# Patient Record
Sex: Female | Born: 1940 | Race: White | Hispanic: No | Smoking: Former smoker
Health system: Southern US, Community
[De-identification: ages and names within clinical notes are randomized; demographics above are authoritative.]

## PROBLEM LIST (undated history)

## (undated) DIAGNOSIS — E785 Hyperlipidemia, unspecified: Secondary | ICD-10-CM

## (undated) DIAGNOSIS — K219 Gastro-esophageal reflux disease without esophagitis: Secondary | ICD-10-CM

## (undated) DIAGNOSIS — I10 Essential (primary) hypertension: Secondary | ICD-10-CM

## (undated) HISTORY — DX: Essential (primary) hypertension: I10

## (undated) HISTORY — DX: Gastro-esophageal reflux disease without esophagitis: K21.9

## (undated) HISTORY — DX: Hyperlipidemia, unspecified: E78.5

## (undated) HISTORY — PX: APPENDECTOMY: SHX54

---
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
## 1969-02-07 HISTORY — PX: TUBAL LIGATION: SHX77

## 1971-02-08 HISTORY — PX: LUNG REMOVAL, PARTIAL: SHX233

## 1972-02-08 HISTORY — PX: ABDOMINAL HYSTERECTOMY: SHX81

## 1994-02-07 HISTORY — PX: CERVICAL FUSION: SHX112

## 2003-11-08 ENCOUNTER — Emergency Department: Payer: Self-pay | Admitting: Unknown Physician Specialty

## 2008-01-30 ENCOUNTER — Emergency Department: Payer: Self-pay | Admitting: Emergency Medicine

## 2008-04-10 ENCOUNTER — Encounter: Payer: Self-pay | Admitting: Pulmonary Disease

## 2008-12-17 ENCOUNTER — Encounter: Payer: Self-pay | Admitting: Pulmonary Disease

## 2009-03-02 ENCOUNTER — Encounter: Payer: Self-pay | Admitting: Pulmonary Disease

## 2009-04-02 ENCOUNTER — Telehealth: Payer: Self-pay | Admitting: Pulmonary Disease

## 2009-04-02 ENCOUNTER — Ambulatory Visit: Payer: Self-pay | Admitting: Pulmonary Disease

## 2009-04-02 DIAGNOSIS — E785 Hyperlipidemia, unspecified: Secondary | ICD-10-CM

## 2009-04-02 DIAGNOSIS — J9610 Chronic respiratory failure, unspecified whether with hypoxia or hypercapnia: Secondary | ICD-10-CM

## 2009-04-02 DIAGNOSIS — J309 Allergic rhinitis, unspecified: Secondary | ICD-10-CM | POA: Insufficient documentation

## 2009-04-02 DIAGNOSIS — J438 Other emphysema: Secondary | ICD-10-CM

## 2009-04-02 DIAGNOSIS — I10 Essential (primary) hypertension: Secondary | ICD-10-CM | POA: Insufficient documentation

## 2009-04-08 ENCOUNTER — Telehealth: Payer: Self-pay | Admitting: Pulmonary Disease

## 2009-04-30 ENCOUNTER — Telehealth: Payer: Self-pay | Admitting: Pulmonary Disease

## 2009-06-17 ENCOUNTER — Telehealth (INDEPENDENT_AMBULATORY_CARE_PROVIDER_SITE_OTHER): Payer: Self-pay | Admitting: *Deleted

## 2009-06-19 ENCOUNTER — Ambulatory Visit: Payer: Self-pay | Admitting: Pulmonary Disease

## 2009-06-19 ENCOUNTER — Inpatient Hospital Stay (HOSPITAL_COMMUNITY): Admission: AD | Admit: 2009-06-19 | Discharge: 2009-06-22 | Payer: Self-pay | Admitting: Pulmonary Disease

## 2009-07-08 ENCOUNTER — Ambulatory Visit: Payer: Self-pay | Admitting: Pulmonary Disease

## 2009-08-18 ENCOUNTER — Telehealth (INDEPENDENT_AMBULATORY_CARE_PROVIDER_SITE_OTHER): Payer: Self-pay | Admitting: *Deleted

## 2009-08-19 ENCOUNTER — Ambulatory Visit: Payer: Self-pay | Admitting: Pulmonary Disease

## 2009-11-05 ENCOUNTER — Ambulatory Visit: Payer: Self-pay | Admitting: Pulmonary Disease

## 2010-01-11 ENCOUNTER — Inpatient Hospital Stay (HOSPITAL_COMMUNITY)
Admission: EM | Admit: 2010-01-11 | Discharge: 2010-01-15 | Payer: Self-pay | Source: Home / Self Care | Attending: Pulmonary Disease | Admitting: Pulmonary Disease

## 2010-02-04 ENCOUNTER — Ambulatory Visit: Payer: Self-pay | Admitting: Pulmonary Disease

## 2010-02-04 DIAGNOSIS — J329 Chronic sinusitis, unspecified: Secondary | ICD-10-CM | POA: Insufficient documentation

## 2010-03-04 ENCOUNTER — Telehealth (INDEPENDENT_AMBULATORY_CARE_PROVIDER_SITE_OTHER): Payer: Self-pay | Admitting: *Deleted

## 2010-03-04 ENCOUNTER — Inpatient Hospital Stay (HOSPITAL_COMMUNITY)
Admission: EM | Admit: 2010-03-04 | Discharge: 2010-03-06 | Payer: Self-pay | Source: Home / Self Care | Attending: Internal Medicine | Admitting: Internal Medicine

## 2010-03-04 LAB — POCT CARDIAC MARKERS
CKMB, poc: 4 ng/mL (ref 1.0–8.0)
Myoglobin, poc: 274 ng/mL (ref 12–200)

## 2010-03-04 LAB — DIFFERENTIAL
Basophils Absolute: 0 10*3/uL (ref 0.0–0.1)
Lymphocytes Relative: 7 % — ABNORMAL LOW (ref 12–46)
Monocytes Relative: 9 % (ref 3–12)
Neutro Abs: 12.2 10*3/uL — ABNORMAL HIGH (ref 1.7–7.7)
Neutrophils Relative %: 84 % — ABNORMAL HIGH (ref 43–77)

## 2010-03-04 LAB — CBC
Hemoglobin: 10.2 g/dL — ABNORMAL LOW (ref 12.0–15.0)
MCHC: 33 g/dL (ref 30.0–36.0)
MCV: 89.8 fL (ref 78.0–100.0)
RBC: 3.44 MIL/uL — ABNORMAL LOW (ref 3.87–5.11)
RDW: 14.5 % (ref 11.5–15.5)

## 2010-03-04 LAB — BASIC METABOLIC PANEL
BUN: 22 mg/dL (ref 6–23)
CO2: 31 mEq/L (ref 19–32)
Chloride: 98 mEq/L (ref 96–112)
Creatinine, Ser: 1.15 mg/dL (ref 0.4–1.2)
GFR calc non Af Amer: 47 mL/min — ABNORMAL LOW (ref >60)

## 2010-03-05 LAB — CBC
HCT: 27.5 % — ABNORMAL LOW (ref 36.0–46.0)
MCH: 29.1 pg (ref 26.0–34.0)
MCHC: 32.7 g/dL (ref 30.0–36.0)
RBC: 3.09 MIL/uL — ABNORMAL LOW (ref 3.87–5.11)
RDW: 14.4 % (ref 11.5–15.5)

## 2010-03-05 LAB — BASIC METABOLIC PANEL
CO2: 30 mEq/L (ref 19–32)
Chloride: 101 mEq/L (ref 96–112)
GFR calc Af Amer: >60 mL/min (ref >60)
Glucose, Bld: 169 mg/dL — ABNORMAL HIGH (ref 70–99)
Potassium: 4.9 mEq/L (ref 3.5–5.1)
Sodium: 140 mEq/L (ref 135–145)

## 2010-03-06 LAB — BASIC METABOLIC PANEL
BUN: 27 mg/dL — ABNORMAL HIGH (ref 6–23)
CO2: 31 mEq/L (ref 19–32)
Chloride: 97 mEq/L (ref 96–112)
GFR calc Af Amer: 59 mL/min — ABNORMAL LOW (ref >60)
Glucose, Bld: 193 mg/dL — ABNORMAL HIGH (ref 70–99)
Sodium: 137 mEq/L (ref 135–145)

## 2010-03-06 LAB — CBC
Hemoglobin: 9.1 g/dL — ABNORMAL LOW (ref 12.0–15.0)
MCHC: 32.6 g/dL (ref 30.0–36.0)
Platelets: 265 10*3/uL (ref 150–400)
RBC: 3.1 MIL/uL — ABNORMAL LOW (ref 3.87–5.11)

## 2010-03-06 LAB — MAGNESIUM: Magnesium: 2.1 mg/dL (ref 1.5–2.5)

## 2010-03-07 NOTE — Discharge Summary (Signed)
[name], [name]                   ACCOUNT NO.:  [id number]  MEDICAL RECORD NO.:  [id number]          PATIENT TYPE:  INP  LOCATION:  1321                         FACILITY:  [facility]  PHYSICIAN:  Andreas Blower, MD       DATE OF BIRTH:  22-May-1940  DATE OF ADMISSION:  03/04/2010 DATE OF DISCHARGE:                              DISCHARGE SUMMARY   PRIMARY CARE PHYSICIAN:  Dr. Drinda Butts at [place].  PULMONOLOGIST:  Barbaraann Share, MD,FCCP  DISCHARGE DIAGNOSES: 1. Shortness of breath, multifactorial due to chronic obstructive     pulmonary disease exacerbation with component of possible diastolic     heart failure. 2. Acute chronic obstructive pulmonary disease exacerbation. 3. Possible diastolic heart failure. 4. Leukocytosis. 5. Hypertension. 6. Depression. 7. Severe stage IV chronic obstructive pulmonary disease. 8. History of chronic sinusitis.  DISCHARGE MEDICATIONS: 1. Lasix 20 mg p.o. q.a.m.  The patient given a prescription for 10     days. 2. Levofloxacin 500 mg p.o. daily to complete 5 day course of     antibiotics. 3. Prednisone 20 mg p.o. daily for one day, then 10 mg p.o. daily for     one day, then 5 mg p.o. daily for one day and then discontinue. 4. Advair Diskus 500/50 one puff inhaled twice daily. 5. Aleve 220 mg p.o. q.12 hours. 6. Aspirin 81 mg daily. 7. Citalopram 40 mg p.o. daily. 8. Flonase two sprays nasally daily. 9. Guaifenesin 400 mg p.o. q.4 hours as needed for congestion. 10.Loratadine 100 mg p.o. daily. 11.Multivitamin once tablet p.o. daily. 12.Prevacid 15 mg p.o. daily. 13.Robitussin DM 100/10 per 5 mL daily at bedtime. 14.Simvastatin 80 mg p.o. daily. 15.Spiriva 18 mcg p.o. daily> 16.Tylenol PM 500/25 mg one to two tablets p.o. daily at bedtime as     needed for sleep. 17.Albuterol 2.5 mg per 3 mL every 4 hours as needed.  BRIEF ADMITTING HISTORY AND PHYSICAL:  [name] is a 70 year old obese Caucasian female with history of severe stage IV  Gold Criteria COPD on 3 liters of home O2 who presents with shortness of breath, nonproductive cough with greenish-colored sputum.  RADIOLOGY/IMAGING:  The patient had a portable chest x-ray which shows findings that most likely represent diffuse interstitial edema.  LABORATORY DATA:  CBC shows a white count of 13.9, hemoglobin 9.1, hematocrit 27.9, platelet count 265.  Electrolytes normal with a creatinine of 1.1.  Blood cultures x2 shows no growth to date.  DISPOSITION AND FOLLOWUP:  The patient to follow up with Dr. Shelle Iron in one week.  Also follow up with Dr. Drinda Butts at [facility], her primary care physician, in 1 to 2 weeks.  Dr. Shelle Iron or Dr. Drinda Butts to address the need for continued use of Lasix at the next clinic visit.  Also instructed the patient to have Dr. Shelle Iron or her primary care physician draw electrolytes and follow up on the labs, given that the patient was started on Lasix.  The patient was also instructed to increase oxygen to 4 to 5 liters with activity and decrease the oxygen back down to 3 liters per minute at  rest.  Also instructed the patient to discuss with her primary care physician or Dr. Shelle Iron about decreasing the citalopram dose to 20 mg, given a concern for possible QT prolongation in people who are older than 68 years old.  Her QTC during the admission process was 427 milliseconds.  HOSPITAL COURSE BY PROBLEM: 1. Shortness of breath, multifactorial, most likely has a compliment     of COPD exacerbation with possible diastolic heart failure,     improved during the course of the hospital stay. 2. COPD exacerbation.  The patient was started initially on Solu-     Medrol which was then transitioned to prednisone with a correct     taper within 5 days.  I spoke to Dr. Shelle Iron who recommended a quick     taper and also extend antibiotic coverage to include for any     possible sinus infection.  As a result, Levaquin was chosen.     Currently the patient is  on oral steroids and p.o. Levaquin at the     time of discharge.  The patient, at the time of discharge, had     prolonged expiration but did not have any wheezing on exam. 3. Possible diastolic heart failure.  The patient had a 2-D     echocardiogram on March 05, 2010, which showed her systolic     function was vigorous with an ejection fraction of 65% to 70%.     There was an increased relative contribution of atrial contraction     to ventricular filling.  The patient was given two doses of IV     Lasix and had noted a significant improvement in her breathing.     The patient will be discharged home on Lasix 20 mg daily.  The     patient will be given a 10-day prescription and will need to follow     with her primary care physician or Dr. Shelle Iron to assess the need     for continued Lasix use and also to have blood work done to check     her electrolytes, given that she has been recently started on Lasix     in 1 week. 4. Leukocytosis, most likely secondary to steroid use, stable during     the course of the hospital stay. 5. Hypertension, stable.  Continue the patient on her home medication. 6. Depression/anxiety.  Continue the Celexa. 7. Chronic sinusitis, addressed as above.  The patient will be on     Levaquin for 4 more days.  Time spent on discharge, talking to the patient and coordinating care was 35 minutes.     Andreas Blower, MD     SR/MEDQ  D:  03/06/2010  T:  03/06/2010  Job:  [id number]  cc:   Barbaraann Share, MD,FCCP [address] [place] [address]  Dr. Vena Rua  Electronically Signed by Wardell Heath Annalucia Laino  on 03/07/2010 04:44:18 PM

## 2010-03-08 NOTE — H&P (Addendum)
[name], [name]                   ACCOUNT NO.:  [id number]  MEDICAL RECORD NO.:  [id number]          PATIENT TYPE:  EMS  LOCATION:  ED                           FACILITY:  [facility]  PHYSICIAN:  Peggye Pitt, M.D. DATE OF BIRTH:  07-02-1940  DATE OF ADMISSION:  03/04/2010 DATE OF DISCHARGE:                             HISTORY & PHYSICAL   PRIMARY CARE PHYSICIAN:  Unassigned.  PULMONOLOGIST:  Barbaraann Share, MD,FCCP.  CHIEF COMPLAINT:  Shortness of breath and productive cough.  HISTORY OF PRESENT ILLNESS:  [name] is a very pleasant morbidly obese 70 year old Caucasian lady who has a history of severe stage IV per GOLD criteria COPD on 3 liters of home O2.  She was most recently in the hospital on December 2011, at which time she was diagnosed with acute respiratory failure due to acute exacerbation of COPD in the setting of probable pneumonia.  At this time, around for the past 3 days, she has been complaining of increased shortness of breath with activities of daily living as well as a cough productive of yellowish greenish sputum.  She tried the albuterol for 3 days (which she does not normally use at home) to see if this would help with the shortness of breath.  However, she has noted that every day, she has progressively required more and more albuterol.  Because of this, she came into the hospital for further evaluation.  Of note, she denies fevers but has complained of cold and hot chills.  Denies chest pain, headaches, abdominal pain, or any other symptoms.  ALLERGIES:  SHE HAS ALLERGIES TO MORPHINE WHICH CAUSES HIVES.  PAST MEDICAL HISTORY:  Significant for: 1. Stage IV COPD. 2. Hypertension. 3. Depression.  HOME MEDICATIONS: 1. Advair 250/50 mg 1 puff twice daily. 2. Spiriva 80 mg inhaled daily. 3. Celexa 40 mg daily. 4. Losartan 100 mg daily. 5. Aspirin 81 mg daily. 6. Naproxen 220 mg twice daily.  SOCIAL HISTORY:  She denies any alcohol or illicit  drug use.  Was a former smoker of 3 packs a day and quit 2 years ago.  FAMILY HISTORY:  Significant for father who died of lung cancer.  Mother who expired from colon cancer.  REVIEW OF SYSTEMS:  Negative except as already mentioned in history of present illness.  PHYSICAL EXAM:  VITALS:  On admission, blood pressure 163/94, heart rate 110, respirations 24, sats of 97% on 5 liters with a temperature of 98.2. GENERAL:  She is alert, awake, oriented x3.  Does have increased work of breathing, however she is able to complete full sentences while speaking. HEENT:  Normocephalic and atraumatic.  Her pupils are equally round and reactive to light and accommodation.  She has intact extraocular movements. NECK:  Supple.  No JVD.  No lymphadenopathy.  No bruits or goiter. HEART:  Tachycardic but with a regular rhythm.  I cannot auscultate any murmurs, rubs, or gallops. LUNGS:  She does not have any wheezes.  However, she has shallow respirations with poor inspiratory movement. ABDOMEN:  Obese, soft, nontender, nondistended with positive bowel sounds. EXTREMITIES:  No clubbing, cyanosis  or edema with 2+ pedal pulses. NEUROLOGIC:  Exam is grossly intact and nonfocal, although I have not ambulated her.  LABS ON ADMISSION:  Sodium 138, potassium 4.7, chloride 98, bicarb 31, BUN 22, creatinine 1.15 with a glucose of 115.  WBC 14.6, hemoglobin 10.2 with an MCV of 89.8 and a platelet count of 238,000.  BMP of 97.4. First set of point care markers are negative.  The chest x-ray that shows diffuse mild interstitial edema.  ASSESSMENT AND PLAN: 1. Shortness of breath and cough.  This is likely secondary to an     acute exacerbation of her COPD.  Will continue her O2 and titrate     up as needed.  Will start her on steroids as well as her frequent     nebs.  Also start her on antibiotics.  I have chosen Avelox for 100     mg daily.  I will also order a 2-D echo as it appears this has     never  been done in the hospital before given the fact that she does     have this mild interstitial edema and history of hypertension.  She     may have somewhat diastolic CHF. 2. For her leukocytosis, will follow with antibiotics. 3. For hypertension, her blood pressure is currently a little bit     elevated.  We will monitor and I will not add any other     antihypertensives at this time other than her chronic home     medications. 4. For her depression, will continue her Celexa. 5. For deep venous thrombosis prophylaxis, will place her on Lovenox     while she is in the hospital.     Peggye Pitt, M.D.     EH/MEDQ  D:  03/04/2010  T:  03/04/2010  Job:  [id number]  cc:   Barbaraann Share, MD,FCCP [address] [place] [address]  Electronically Signed by Peggye Pitt M.D. on 03/08/2010 08:07:31 AM

## 2010-03-09 ENCOUNTER — Telehealth (INDEPENDENT_AMBULATORY_CARE_PROVIDER_SITE_OTHER): Payer: Self-pay | Admitting: *Deleted

## 2010-03-09 LAB — CULTURE, RESPIRATORY W GRAM STAIN: Culture: NORMAL

## 2010-03-09 NOTE — Letter (Signed)
Summary: [facility]  [facility]   Imported By: Lester Uintah 04/16/2009 09:27:24  _____________________________________________________________________  External Attachment:    Type:   Image     Comment:   External Document

## 2010-03-09 NOTE — Progress Notes (Signed)
Summary: rx request  Phone Note Call from Patient Call back at Home Phone [phone]   Caller: Patient Call For: clance Summary of Call: pt requests spiriva called in to [facility] [phone]. (this msg per margaret who has logged off now).  Initial call taken by: Tivis Ringer, CNA,  Jun 17, 2009 5:08 PM  Follow-up for Phone Call        [facility], pt needing rx for spiriva.  Do you want to become responsible prescriber for this med since it is a pulm med.  We have never filled this for pt before.  Please advise.  Thanks! Gweneth Dimitri RN  Jun 17, 2009 5:24 PM   Additional Follow-up for Phone Call Additional follow up Details #1::        yes, go ahead and fill.  thanks. Additional Follow-up by: Barbaraann Share MD,  Jun 17, 2009 8:23 PM    Additional Follow-up for Phone Call Additional follow up Details #2::    Pt's daughter informed that refill was sent to pharmacy. Abigail Miyamoto RN  Jun 18, 2009 9:14 AM   Prescriptions: SPIRIVA HANDIHALER 18 MCG CAPS (TIOTROPIUM BROMIDE MONOHYDRATE) Inhale contents of 1 capsule once a day  #1 x 5   Entered by:   Abigail Miyamoto RN   Authorized by:   Barbaraann Share MD   Signed by:   Abigail Miyamoto RN on 06/18/2009   Method used:   Electronically to        [facility] [address] [phone]* (retail)       [address] [place]  [address]       Ph: [phone]       Fax: [phone]   RxID:   [id number]

## 2010-03-09 NOTE — Progress Notes (Signed)
Summary: needs liquid oxygen  Phone Note Call from Patient Call back at [phone]   Caller: Patient Call For: clance Summary of Call: patient said [facility] sent an order to [place] to be approved for liquid oxygen, she hasnt head anything yet. she would like someon to call her regarding this Initial call taken by: Valinda Hoar,  April 30, 2009 12:20 PM  Follow-up for Phone Call        kc do you know anything about this? Follow-up by: Oneita Jolly,  April 30, 2009 3:51 PM  Additional Follow-up for Phone Call Additional follow up Details #1::        i do not know anything about this, but is ok if they assess her for liquid oxygen. Additional Follow-up by: Barbaraann Share MD,  April 30, 2009 5:11 PM    Additional Follow-up for Phone Call Additional follow up Details #2::    order given to [facility] to acess for liquid 02 Follow-up by: Oneita Jolly,  April 30, 2009 5:12 PM

## 2010-03-09 NOTE — Assessment & Plan Note (Signed)
Summary: Acute NP office visit - emphysema   Primary Provider/Referring Provider:  None  CC:  chest congestion, prod cough with green mucus, and increased SOB and wheezing x5days .  History of Present Illness: 70 yo female with known hx of oxygen dependent severe emphysema.    07/08/09-follow up She was recently in the hospital for atypical chest pain, and was felt to have mild copd exac.  Workup for her chest pain was unrevealing, and is felt to be MSK in nature.  She did improve with IV steroids and aggressive bronchodilators while in the hospital.  She comes in today where she is doing better, and feels she is back to baseline.  She has minimal cough, no purulence or chest congestion.   August 19, 2009 Presents for an acute office visit. Complains of chest congestion, prod cough with green mucus, increased SOB and wheezing x5days - Pain w/ coughing. Using nasproxen. Says she got some better from flare in June but cough never went away. Now cough is back w/ lots of wheezing. Denies chest pain, orthopnea, hemoptysis, fever, n/v/d, edema, headache.   Medications Prior to Update: 1)  Simvastatin 80 Mg Tabs (Simvastatin) .... Take 1 Tablet By Mouth Once A Day 2)  Citalopram Hydrobromide 40 Mg Tabs (Citalopram Hydrobromide) .... Take 1 Tablet By Mouth Once A Day. 3)  Lisinopril 40 Mg Tabs (Lisinopril) .... Take 1 Tablet By Mouth Once A Day 4)  Lansoprazole 30 Mg Tbdp (Lansoprazole) .... Take 1 Tablet By Mouth Two Times A Day 5)  Fish Oil 1000 Mg Caps (Omega-3 Fatty Acids) .... Take 1 Tablet By Mouth Two Times A Day 6)  Tylenol Pm Extra Strength 500-25 Mg Tabs (Diphenhydramine-Apap (Sleep)) .... Take 2 Tabs By Mouth At Bedtime 7)  Centrum Silver  Tabs (Multiple Vitamins-Minerals) .... Take 1 Tablet By Mouth Once A Day 8)  Naproxen Sodium 220 Mg Tabs (Naproxen Sodium) .... Take By Mouth As Needed 9)  Spiriva Handihaler 18 Mcg Caps (Tiotropium Bromide Monohydrate) .... Inhale Contents of 1 Capsule Once  A Day 10)  Oxygen .... 2 Lpm Cont 11)  Advair Diskus 500-50 Mcg/dose  Misc (Fluticasone-Salmeterol) .... Inhale 1 Puff Two Times A Day 12)  Albuterol Sulfate (2.5 Mg/52ml) 0.083%  Nebu (Albuterol Sulfate) .... Every 6 Hours Only If Needed.  Current Medications (verified): 1)  Simvastatin 80 Mg Tabs (Simvastatin) .... Take 1 Tablet By Mouth Once A Day 2)  Citalopram Hydrobromide 40 Mg Tabs (Citalopram Hydrobromide) .... Take 1 Tablet By Mouth Once A Day. 3)  Lisinopril 40 Mg Tabs (Lisinopril) .... Take 1 Tablet By Mouth Once A Day 4)  Lansoprazole 30 Mg Tbdp (Lansoprazole) .... Take 1 Tablet By Mouth Two Times A Day 5)  Fish Oil 1000 Mg Caps (Omega-3 Fatty Acids) .... Take 1 Tablet By Mouth Two Times A Day 6)  Tylenol Pm Extra Strength 500-25 Mg Tabs (Diphenhydramine-Apap (Sleep)) .... Take 2 Tabs By Mouth At Bedtime 7)  Centrum Silver  Tabs (Multiple Vitamins-Minerals) .... Take 1 Tablet By Mouth Once A Day 8)  Naproxen Sodium 220 Mg Tabs (Naproxen Sodium) .... Take By Mouth As Needed 9)  Spiriva Handihaler 18 Mcg Caps (Tiotropium Bromide Monohydrate) .... Inhale Contents of 1 Capsule Once A Day 10)  Oxygen .... 2 Lpm Cont 11)  Advair Diskus 500-50 Mcg/dose  Misc (Fluticasone-Salmeterol) .... Inhale 1 Puff Two Times A Day 12)  Albuterol Sulfate (2.5 Mg/9ml) 0.083%  Nebu (Albuterol Sulfate) .... Every 6 Hours Only If Needed.  Allergies (  verified): 1)  ! Morphine  Past History:  Past Medical History: Last updated: 06/19/2009 Emphysema--FEV1 42% 03/02/09 s/p partial right lobectomy 1973  ALLERGIC RHINITIS (ICD-477.9) HYPERTENSION (ICD-401.9)--controlled on rx  HYPERLIPIDEMIA (ICD-272.4)--controlled on rx  GERD- on meds.     Past Surgical History: Last updated: 04/02/2009 partial lobectomy R lung 1973 cervical fusion 1996 hysterectomy 1974 tubal ligation 1971 appendectomy cleft palate surgery 1948  Family History: Last updated: 04/02/2009 allergies: daughter asthma:  daughter heart disease: mother, brother cancer: father (colon)   Social History: Last updated: 04/02/2009 Patient states former smoker.  started at age 34.  3 ppd.  quit Jan 2010. pt is divorced. pt has 4 children:  3 living, 1 deceased. pt is retired.  prev worked at [employer]    Risk Factors: Smoking Status: quit (04/02/2009)  Review of Systems      See HPI  Vital Signs:  Patient profile:   70 year old female Height:      64 inches Weight:      202.13 pounds BMI:     34.82 O2 Sat:      98 % on 2 L/min pulsing Temp:     98.3 degrees F oral Pulse rate:   73 / minute BP sitting:   140 / 70  (right arm) Cuff size:   regular  Vitals Entered By: Boone Master CNA/MA (August 19, 2009 10:34 AM)  O2 Flow:  2 L/min pulsing CC: chest congestion, prod cough with green mucus, increased SOB and wheezing x5days  Is Patient Diabetic? No Comments Medications reviewed with patient Daytime contact number verified with patient. Boone Master CNA/MA  August 19, 2009 10:35 AM    Physical Exam  Additional Exam:  GEN: A/Ox3; pleasant , no increased WOB noted. elderly.  HEENT:  [place]/AT, , EACs-clear, TMs-wnl, NOSE-clear, THROAT-clear NECK:  Supple w/ fair ROM; no JVD; normal carotid impulses w/o bruits; no thyromegaly or nodules palpated; no lymphadenopathy. RESP  Coarse BS w/ exp wheezing, upper airway psuedowheeze  CARD:  RRR, no m/r/g , tr edema, venous insufficiency changes.  GI:   Soft & nt; nml bowel sounds; no organomegaly or masses detected. [place]: Warm bil,  no calf tenderness edema, clubbing, pulses intact Neuro:  intact w/ no focal deficits.    Impression & Recommendations:  Problem # 1:  EMPHYSEMA (ICD-492.8) COPD exacerbation-recurrent w/ upper airway cough syndrome.  REC:  Augmentin 875mg  two times a day w/ food for 7days Prednisone taper over next week.  NO Naproxen or NSAIDS while on prednisone Mucinex DM two times a day as needed cough/congestion Warm heat to ribs as  needed  Hold Fish Oil.  We are changing Lisinopril to Diovan 320mg  once daily.  Would avoid ACE Inhibitors because of cough.  follow up with family doctor tomorrow as scheduled, will need to follow w/ them for blood pressure.  follow up Dr. Shelle Iron as scheduled.  Please contact office for sooner follow up if symptoms do not improve or worsen  Orders: T-2 View CXR (71020TC) Est. Patient Level IV ([address])  Medications Added to Medication List This Visit: 1)  Diovan 320 Mg Tabs (Valsartan) .Marland Kitchen.. 1 by mouth once daily 2)  Fish Oil 1000 Mg Caps (Omega-3 fatty acids) .... Take 1 tablet by mouth two times a day--hold 3)  Augmentin 875-125 Mg Tabs (Amoxicillin-pot clavulanate) .Marland Kitchen.. 1 by mouth two times a day 4)  Prednisone 10 Mg Tabs (Prednisone) .... 4 tabs for 2 days, then 3 tabs for 2 days, 2 tabs for  2 days, then 1 tab for 2 days, then stop  Complete Medication List: 1)  Simvastatin 80 Mg Tabs (Simvastatin) .... Take 1 tablet by mouth once a day 2)  Citalopram Hydrobromide 40 Mg Tabs (Citalopram hydrobromide) .... Take 1 tablet by mouth once a day. 3)  Diovan 320 Mg Tabs (Valsartan) .Marland Kitchen.. 1 by mouth once daily 4)  Lansoprazole 30 Mg Tbdp (Lansoprazole) .... Take 1 tablet by mouth two times a day 5)  Fish Oil 1000 Mg Caps (Omega-3 fatty acids) .... Take 1 tablet by mouth two times a day--hold 6)  Tylenol Pm Extra Strength 500-25 Mg Tabs (Diphenhydramine-apap (sleep)) .... Take 2 tabs by mouth at bedtime 7)  Centrum Silver Tabs (Multiple vitamins-minerals) .... Take 1 tablet by mouth once a day 8)  Naproxen Sodium 220 Mg Tabs (Naproxen sodium) .... Take by mouth as needed 9)  Spiriva Handihaler 18 Mcg Caps (Tiotropium bromide monohydrate) .... Inhale contents of 1 capsule once a day 10)  Oxygen  .... 2 lpm cont 11)  Advair Diskus 500-50 Mcg/dose Misc (Fluticasone-salmeterol) .... Inhale 1 puff two times a day 12)  Albuterol Sulfate (2.5 Mg/49ml) 0.083% Nebu (Albuterol sulfate) .... Every 6 hours  only if needed. 13)  Augmentin 875-125 Mg Tabs (Amoxicillin-pot clavulanate) .Marland Kitchen.. 1 by mouth two times a day 14)  Prednisone 10 Mg Tabs (Prednisone) .... 4 tabs for 2 days, then 3 tabs for 2 days, 2 tabs for 2 days, then 1 tab for 2 days, then stop  Patient Instructions: 1)  Augmentin 875mg  two times a day w/ food for 7days 2)  Prednisone taper over next week.  3)  NO Naproxen or NSAIDS while on prednisone 4)  Mucinex DM two times a day as needed cough/congestion 5)  Warm heat to ribs as needed  6)  Hold Fish Oil.  7)  We are changing Lisinopril to Diovan 320mg  once daily.  8)  Would avoid ACE Inhibitors because of cough.  9)  follow up with family doctor tomorrow as scheduled, will need to follow w/ them for blood pressure.  10)  follow up Dr. Shelle Iron as scheduled.  11)  Please contact office for sooner follow up if symptoms do not improve or worsen  Prescriptions: PREDNISONE 10 MG TABS (PREDNISONE) 4 tabs for 2 days, then 3 tabs for 2 days, 2 tabs for 2 days, then 1 tab for 2 days, then stop  #20 x 0   Entered and Authorized by:   Rubye Oaks NP   Signed by:   Rubye Oaks NP on 08/19/2009   Method used:   Electronically to        [facility] [phone][facility] (retail)       [address] [place], [place]  [address]       Ph: [phone] or [phone]       Fax: [phone]   RxID:   [id number] AUGMENTIN 875-125 MG TABS (AMOXICILLIN-POT CLAVULANATE) 1 by mouth two times a day  #14 x 0   Entered and Authorized by:   Rubye Oaks NP   Signed by:   Rubye Oaks NP on 08/19/2009   Method used:   Electronically to        [facility] [phone]* (retail)       [address] [place], [place]  [address]       Ph: [phone] or [phone]       Fax: [phone]   RxID:   [id number]    Immunization History:  Influenza Immunization History:    Influenza:  historical (11/07/2008)  Pneumovax Immunization History:     Pneumovax:  historical (11/07/2008)   Appended Document: Acute NP office visit - emphysema no PCP on file.  called spoke with patient who states her PCP is Dr. Irven Easterly @ [facility].  ov note faxed via biscom to pcp per TP's request.

## 2010-03-09 NOTE — Assessment & Plan Note (Signed)
Summary: HOSPITAL ADMISSION   Primary Provider/Referring Provider:  None  CC:  increased SOB, wheezing, prod cough, pain in left rib area, and low grade temp x1week.  History of Present Illness: HOSPITAL ADMISSION  The pt is a 70y/o with known hx of COPD, severe emphysema --FEV1 42% (02/2009) seen for initial pulmonary evaluation 04/2009. Previously a pt of [facility] , she established w/ Dr. Shelle Iron when she moved to [facility] to get closer to family. She is on continuous O2 at 2 l/m rest, 3 l/m with activity.  Previous in hospital where she required ETI mech. ventilation. 02/2008.    Jun 19, 2009--Presents for an acute office visit. Complains of increased SOB, wheezing, prod cough, pain in left rib area, low grade temp x1week.  Today on arrival she had O2 sat 77% on 2 l/m continuously she did increased >90% at rest and O2 increased 3l/m She rates herself 7-8/10 score w/ 0 at her baseline and 10 worst  Baseline she can walk 25-35 feet w/o significant dyspnea. Now she can walk only  few steps without significant dyspnea. c/o pain along lower left ribs w/ inspiration. Denies chest pain,  orthopnea, hemoptysis,  n/v/d, edema, headache,recent travel or antibioitics, calf pain, urinary symptoms  Medications Prior to Update: 1)  Simvastatin 80 Mg Tabs (Simvastatin) .... Take 1 Tablet By Mouth Once A Day 2)  Vitamin D 1.25 .... Once Daily 3)  Theophylline Cr 300 Mg Xr12h-Tab (Theophylline) .... Take 1 Tablet By Mouth Two Times A Day 4)  Amlodipine Besylate 10 Mg Tabs (Amlodipine Besylate) .... Take 1 Tablet By Mouth Once A Day 5)  Citalopram Hydrobromide 40 Mg Tabs (Citalopram Hydrobromide) .... Take 1 Tablet By Mouth Once A Day. 6)  Lisinopril 40 Mg Tabs (Lisinopril) .... Take 1 Tablet By Mouth Once A Day 7)  Lansoprazole 30 Mg Tbdp (Lansoprazole) .... Take 1 Tablet By Mouth Two Times A Day 8)  Fish Oil 1000 Mg Caps (Omega-3 Fatty Acids) .... Take 1 Tablet By Mouth Two Times A Day 9)  Tylenol Pm  Extra Strength 500-25 Mg Tabs (Diphenhydramine-Apap (Sleep)) .... Take 2 Tabs By Mouth At Bedtime 10)  Centrum Silver  Tabs (Multiple Vitamins-Minerals) .... Take 1 Tablet By Mouth Once A Day 11)  Aleve 220 Mg Tabs (Naproxen Sodium) .... Take 1 Tablet By Mouth Two Times A Day 12)  Spiriva Handihaler 18 Mcg Caps (Tiotropium Bromide Monohydrate) .... Inhale Contents of 1 Capsule Once A Day 13)  Oxygen .... 2 Lpm Cont 14)  Spiriva Handihaler 18 Mcg  Caps (Tiotropium Bromide Monohydrate) .... Inhale Contents of 1 Capsule Once A Day 15)  Advair Diskus 500-50 Mcg/dose  Misc (Fluticasone-Salmeterol) .... Inhale 1 Puff Two Times A Day 16)  Albuterol Sulfate (2.5 Mg/35ml) 0.083%  Nebu (Albuterol Sulfate) .... Every 6 Hours Only If Needed.  Current Medications (verified): 1)  Simvastatin 80 Mg Tabs (Simvastatin) .... Take 1 Tablet By Mouth Once A Day 2)  Vitamin D 1.25 .... Once Daily 3)  Amlodipine Besylate 10 Mg Tabs (Amlodipine Besylate) .... Take 1 Tablet By Mouth Once A Day 4)  Citalopram Hydrobromide 40 Mg Tabs (Citalopram Hydrobromide) .... Take 1 Tablet By Mouth Once A Day. 5)  Lisinopril 40 Mg Tabs (Lisinopril) .... Take 1 Tablet By Mouth Once A Day 6)  Lansoprazole 30 Mg Tbdp (Lansoprazole) .... Take 1 Tablet By Mouth Two Times A Day 7)  Fish Oil 1000 Mg Caps (Omega-3 Fatty Acids) .... Take 1 Tablet By Mouth Two Times A  Day 8)  Tylenol Pm Extra Strength 500-25 Mg Tabs (Diphenhydramine-Apap (Sleep)) .... Take 2 Tabs By Mouth At Bedtime 9)  Centrum Silver  Tabs (Multiple Vitamins-Minerals) .... Take 1 Tablet By Mouth Once A Day 10)  Aleve 220 Mg Tabs (Naproxen Sodium) .... Take 1 Tablet By Mouth Two Times A Day 11)  Spiriva Handihaler 18 Mcg Caps (Tiotropium Bromide Monohydrate) .... Inhale Contents of 1 Capsule Once A Day 12)  Oxygen .... 2 Lpm Cont 13)  Advair Diskus 500-50 Mcg/dose  Misc (Fluticasone-Salmeterol) .... Inhale 1 Puff Two Times A Day 14)  Albuterol Sulfate (2.5 Mg/89ml) 0.083%   Nebu (Albuterol Sulfate) .... Every 6 Hours Only If Needed.  Allergies (verified): 1)  ! Morphine  Past History:  Past Surgical History: Last updated: 04/02/2009 partial lobectomy R lung 1973 cervical fusion 1996 hysterectomy 1974 tubal ligation 1971 appendectomy cleft palate surgery 1948  Family History: Last updated: 04/02/2009 allergies: daughter asthma: daughter heart disease: mother, brother cancer: father (colon)   Social History: Last updated: 04/02/2009 Patient states former smoker.  started at age 80.  3 ppd.  quit Jan 2010. pt is divorced. pt has 4 children:  3 living, 1 deceased. pt is retired.  prev worked at [employer]    Risk Factors: Smoking Status: quit (04/02/2009)  Past Medical History: Emphysema--FEV1 42% 03/02/09 s/p partial right lobectomy 1973  ALLERGIC RHINITIS (ICD-477.9) HYPERTENSION (ICD-401.9)--controlled on rx  HYPERLIPIDEMIA (ICD-272.4)--controlled on rx  GERD- on meds.     Review of Systems  The patient denies anorexia, weight loss, weight gain, vision loss, decreased hearing, hoarseness, chest pain, syncope, peripheral edema, headaches, hemoptysis, abdominal pain, melena, hematochezia, severe indigestion/heartburn, hematuria, incontinence, muscle weakness, suspicious skin lesions, transient blindness, difficulty walking, depression, unusual weight change, abnormal bleeding, enlarged lymph nodes, and angioedema.    Vital Signs:  Patient profile:   70 year old female Height:      64 inches Weight:      198 pounds BMI:     34.11 O2 Sat:      92 % on 2 L/min cont Temp:     97.7 degrees F oral Pulse rate:   102 / minute BP sitting:   150 / 70  (left arm) Cuff size:   regular  Vitals Entered By: Boone Master CNA (Jun 19, 2009 3:52 PM)  O2 Flow:  2 L/min cont  O2 Sat Comments when pt entered exam room oxygen sat was 78% on 2L cont.  allowed pt to rest x68minutes and o2 sat increased to   Physical Exam  Additional Exam:  GEN:  A/Ox3; pleasant , no increased WOB noted. elderly.  HEENT:  [place]/AT, , EACs-clear, TMs-wnl, NOSE-clear, THROAT-clear NECK:  Supple w/ fair ROM; no JVD; normal carotid impulses w/o bruits; no thyromegaly or nodules palpated; no lymphadenopathy. RESP  Coarse BS w/ no wheezing or crackles, diminshed BS in bases.  CARD:  RRR, no m/r/g , tr edema, venous insufficiency changes.  GI:   Soft & nt; nml bowel sounds; no organomegaly or masses detected. Musco: Warm bil,  no calf tenderness edema, clubbing, pulses intact Neuro:  intact w/ no focal deficits.    Impression & Recommendations:  Problem # 1:  EMPHYSEMA (ICD-492.8) Exacerbation w/ associated acute on chronic resp failure REC:  Admit for IV abx, steroids and nebulized bronchodilators PPI for GI prophylaxis Lovenox for DVT prophylaxis xray and labs pending.  hold fish oil d/t potential for upper airway irritation.    Orders: T-2 View CXR (71020TC)  Problem #  2:  HYPERTENSION (ICD-401.9) Will stop ACE and change to Diovan for possible ACE induced cough and potential for upperairway irritation.   Her updated medication list for this problem includes:    Amlodipine Besylate 10 Mg Tabs (Amlodipine besylate) .Marland Kitchen... Take 1 tablet by mouth once a day    Lisinopril 40 Mg Tabs (Lisinopril) .Marland Kitchen... Take 1 tablet by mouth once a day  Complete Medication List: 1)  Simvastatin 80 Mg Tabs (Simvastatin) .... Take 1 tablet by mouth once a day 2)  Vitamin D 1.25  .... Once daily 3)  Amlodipine Besylate 10 Mg Tabs (Amlodipine besylate) .... Take 1 tablet by mouth once a day 4)  Citalopram Hydrobromide 40 Mg Tabs (Citalopram hydrobromide) .... Take 1 tablet by mouth once a day. 5)  Lisinopril 40 Mg Tabs (Lisinopril) .... Take 1 tablet by mouth once a day 6)  Lansoprazole 30 Mg Tbdp (Lansoprazole) .... Take 1 tablet by mouth two times a day 7)  Fish Oil 1000 Mg Caps (Omega-3 fatty acids) .... Take 1 tablet by mouth two times a day 8)  Tylenol Pm Extra  Strength 500-25 Mg Tabs (Diphenhydramine-apap (sleep)) .... Take 2 tabs by mouth at bedtime 9)  Centrum Silver Tabs (Multiple vitamins-minerals) .... Take 1 tablet by mouth once a day 10)  Aleve 220 Mg Tabs (Naproxen sodium) .... Take 1 tablet by mouth two times a day 11)  Spiriva Handihaler 18 Mcg Caps (Tiotropium bromide monohydrate) .... Inhale contents of 1 capsule once a day 12)  Oxygen  .... 2 lpm cont 13)  Advair Diskus 500-50 Mcg/dose Misc (Fluticasone-salmeterol) .... Inhale 1 puff two times a day 14)  Albuterol Sulfate (2.5 Mg/38ml) 0.083% Nebu (Albuterol sulfate) .... Every 6 hours only if needed.  Patient Instructions: 1)  Admit to hospital   {  DOCUMENT.TEMP_CCC_IM_COMMIT_TRIGGER ccc_immun_all_given_txt() + ccc_immun_all_to_be_given_txt(_ccc_IM_give_today_ary,_ccc_IM_vax_num_ary,_ccc_IM_other_cb_ary,"Peds") <-FUNCTION DEFINITION IS NOT EXECUTABLE DOCUMENT.TEMP_CCC_IM_COMMIT_TRIGGER ccc_immun_all_given_txt() + ccc_immun_all_to_be_given_txt(_ccc_IM_give_today_ary,_ccc_IM_vax_num_ary,_ccc_IM_other_cb_ary,"Peds") <-FUNCTION DEFINITION IS NOT EXECUTABLE

## 2010-03-09 NOTE — Assessment & Plan Note (Signed)
Summary: rov for emphysema   Primary Provider/Referring Provider:  [facility])   CC:  4 month follow up. Pt states her breahting has been doing okay. Pt c/o of productive cough with clear phlem. Pt states when she goes to bed at night her nose is stopped up and she can't breathe. Pt doesn't smoke.  History of Present Illness: The pt comes in today for f/u of her known emphysema with chronic respiratory failure.  She has been doing fairly well since the summer, and has had no recent chest infections or acute exacerbations.  She did have a flare in July that was treated by our NP.  She denies significant cough or mucus.  She is having a lot of issues with nasal congestion, and can blow nonpurulent mucus from nares at night.  Her nasal congestion is interferring with her nasal oxygen flow at night while sleeping.  Current Medications (verified): 1)  Lovastatin 40 Mg Tabs (Lovastatin) .... Once Daily 2)  Citalopram Hydrobromide 40 Mg Tabs (Citalopram Hydrobromide) .... Take 1 Tablet By Mouth Once A Day. 3)  Diovan 320 Mg Tabs (Valsartan) .Marland Kitchen.. 1 By Mouth Once Daily 4)  Lansoprazole 30 Mg Tbdp (Lansoprazole) .... Take 1 Tablet By Mouth Two Times A Day 5)  Tylenol Pm Extra Strength 500-25 Mg Tabs (Diphenhydramine-Apap (Sleep)) .... Take 2 Tabs By Mouth At Bedtime 6)  Centrum Silver  Tabs (Multiple Vitamins-Minerals) .... Take 1 Tablet By Mouth Once A Day 7)  Naproxen Sodium 220 Mg Tabs (Naproxen Sodium) .... Take By Mouth As Needed 8)  Spiriva Handihaler 18 Mcg Caps (Tiotropium Bromide Monohydrate) .... Inhale Contents of 1 Capsule Once A Day 9)  Oxygen .... 2 Lpm Cont 10)  Advair Diskus 500-50 Mcg/dose  Misc (Fluticasone-Salmeterol) .... Inhale 1 Puff Two Times A Day 11)  Albuterol Sulfate (2.5 Mg/20ml) 0.083%  Nebu (Albuterol Sulfate) .... Every 6 Hours Only If Needed. 12)  Fluticasone Propionate 50 Mcg/act Susp (Fluticasone Propionate) .Marland Kitchen.. 1 Spray in Each Nostile At  Bedtime 13)  Losartan Potassium 100 Mg Tabs (Losartan Potassium) .... Once Daily 14)  Aspirin 81 Mg Tabs (Aspirin) .... 2 Tablets Once Daily 15)  Guaifenesin 400 Mg Tabs (Guaifenesin) .... One Tablet Four Times A Day  Allergies (verified): 1)  ! Morphine  Past History:  Past medical, surgical, family and social histories (including risk factors) reviewed, and no changes noted (except as noted below).  Past Medical History: Reviewed history from 06/19/2009 and no changes required. Emphysema--FEV1 42% 03/02/09 s/p partial right lobectomy 1973  ALLERGIC RHINITIS (ICD-477.9) HYPERTENSION (ICD-401.9)--controlled on rx  HYPERLIPIDEMIA (ICD-272.4)--controlled on rx  GERD- on meds.     Past Surgical History: Reviewed history from 04/02/2009 and no changes required. partial lobectomy R lung 1973 cervical fusion 1996 hysterectomy 1974 tubal ligation 1971 appendectomy cleft palate surgery 1948  Family History: Reviewed history from 04/02/2009 and no changes required. allergies: daughter asthma: daughter heart disease: mother, brother cancer: father (colon)   Social History: Reviewed history from 04/02/2009 and no changes required. Patient states former smoker.  started at age 34.  3 ppd.  quit Jan 2010. pt is divorced. pt has 4 children:  3 living, 1 deceased. pt is retired.  prev worked at [employer]    Review of Systems       The patient complains of shortness of breath with activity, productive cough, acid heartburn, nasal congestion/difficulty breathing through nose, and joint stiffness or pain.  The patient denies shortness of breath at rest, non-productive  cough, coughing up blood, chest pain, irregular heartbeats, indigestion, loss of appetite, weight change, abdominal pain, difficulty swallowing, sore throat, tooth/dental problems, headaches, sneezing, itching, ear ache, anxiety, depression, hand/feet swelling, rash, and fever.    Vital Signs:  Patient profile:   70  year old female Height:      64 inches Weight:      207.13 pounds BMI:     35.68 O2 Sat:      95 % on 2 L/min Temp:     98 degrees F oral Pulse rate:   89 / minute BP sitting:   126 / 68  (right arm) Cuff size:   regular  Vitals Entered By: Carver Fila (November 05, 2009 2:53 PM)  O2 Flow:  2 L/min CC: 4 month follow up. Pt states her breahting has been doing okay. Pt c/o of productive cough with clear phlem. Pt states when she goes to bed at night her nose is stopped up and she can't breathe. Pt doesn't smoke Comments meds and allergies updated Phone number updated Carver Fila  November 05, 2009 2:53 PM  .i  Physical Exam  General:  ow female in nad Nose:  no purulence or drainage noted. Lungs:  decreased bs, no wheezing or rhonchi Heart:  rrr, no mrg Extremities:  no significant edema, no cyanosis   Neurologic:  alert and oriented,moves all 4.   Impression & Recommendations:  Problem # 1:  EMPHYSEMA (ICD-492.8)  the pt is near her usual baseline on her current meds and supplemental oxygen.  I have asked her to stay active, and mentioned to her again about pulmonary rehab at [facility].  She declined, and feels she can work on program at home.    Problem # 2:  ALLERGIC RHINITIS (ICD-477.9)  the pt is having a lot of nasal issues/congestion that she thinks negatively impacts her breathing.  I have asked her to try neilmed sinus rinses, and to increase her flonase dose.  Medications Added to Medication List This Visit: 1)  Lovastatin 40 Mg Tabs (Lovastatin) .... Once daily 2)  Fluticasone Propionate 50 Mcg/act Susp (Fluticasone propionate) .Marland Kitchen.. 1 spray in each nostile at bedtime 3)  Losartan Potassium 100 Mg Tabs (Losartan potassium) .... Once daily 4)  Aspirin 81 Mg Tabs (Aspirin) .... 2 tablets once daily 5)  Guaifenesin 400 Mg Tabs (Guaifenesin) .... One tablet four times a day  Other Orders: Est. Patient Level IV (16109)  Patient Instructions: 1)  can increase  flonase nasal spray to 2 each nostril once a day 2)  use neilmed sinus rinses at night before going to bed. 3)  stay on current breathing medications. 4)  followup with me in 4mos.   Immunization History:  Influenza Immunization History:    Influenza:  historical (10/01/2009)

## 2010-03-09 NOTE — Assessment & Plan Note (Signed)
Summary: hospital f/u for copd exacerbation   Primary Provider/Referring Provider:  None  CC:  Pt is here for a post hosp f/u appt.  Pt was at [facility] from May 13 to May 16 and 2011.  Pt states breathing has improved since hosp stay but still has sob with exertion.  Pt also c/o coughing up clear sputum.  Marland Kitchen  History of Present Illness: the pt comes in today for f/u of her known oxygen dependent severe emphysema.  She was recently in the hospital for atypical chest pain, and was felt to have mild copd exac.  Workup for her chest pain was unrevealing, and is felt to be MSK in nature.  She did improve with IV steroids and aggressive bronchodilators while in the hospital.  She comes in today where she is doing better, and feels she is back to baseline.  She has minimal cough, no purulence or chest congestion.   Current Medications (verified): 1)  Simvastatin 80 Mg Tabs (Simvastatin) .... Take 1 Tablet By Mouth Once A Day 2)  Citalopram Hydrobromide 40 Mg Tabs (Citalopram Hydrobromide) .... Take 1 Tablet By Mouth Once A Day. 3)  Lisinopril 40 Mg Tabs (Lisinopril) .... Take 1 Tablet By Mouth Once A Day 4)  Lansoprazole 30 Mg Tbdp (Lansoprazole) .... Take 1 Tablet By Mouth Two Times A Day 5)  Fish Oil 1000 Mg Caps (Omega-3 Fatty Acids) .... Take 1 Tablet By Mouth Two Times A Day 6)  Tylenol Pm Extra Strength 500-25 Mg Tabs (Diphenhydramine-Apap (Sleep)) .... Take 2 Tabs By Mouth At Bedtime 7)  Centrum Silver  Tabs (Multiple Vitamins-Minerals) .... Take 1 Tablet By Mouth Once A Day 8)  Naproxen Sodium 220 Mg Tabs (Naproxen Sodium) .... Take By Mouth As Needed 9)  Spiriva Handihaler 18 Mcg Caps (Tiotropium Bromide Monohydrate) .... Inhale Contents of 1 Capsule Once A Day 10)  Oxygen .... 2 Lpm Cont 11)  Advair Diskus 500-50 Mcg/dose  Misc (Fluticasone-Salmeterol) .... Inhale 1 Puff Two Times A Day 12)  Albuterol Sulfate (2.5 Mg/76ml) 0.083%  Nebu (Albuterol Sulfate) .... Every 6 Hours Only If  Needed.  Allergies (verified): 1)  ! Morphine  Review of Systems       The patient complains of shortness of breath with activity, productive cough, acid heartburn, indigestion, and nasal congestion/difficulty breathing through nose.  The patient denies shortness of breath at rest, non-productive cough, coughing up blood, chest pain, irregular heartbeats, loss of appetite, weight change, abdominal pain, difficulty swallowing, sore throat, tooth/dental problems, headaches, sneezing, itching, ear ache, anxiety, depression, hand/feet swelling, joint stiffness or pain, rash, change in color of mucus, and fever.    Vital Signs:  Patient profile:   70 year old female Height:      64 inches Weight:      194 pounds O2 Sat:      94 % on 2 LPM cont Temp:     98.1 degrees F oral Pulse rate:   94 / minute BP sitting:   128 / 70  (left arm) Cuff size:   regular  Vitals Entered By: Arman Filter LPN (July 08, 1608 3:19 PM)  O2 Flow:  2 LPM cont CC: Pt is here for a post hosp f/u appt.  Pt was at [facility] from May 13 to Jun 22, 2009.  Pt states breathing has improved since hosp stay but still has sob with exertion.  Pt also c/o coughing up clear sputum.   Comments Medications reviewed with patient Arman Filter  LPN  July 08, 1608 3:21 PM    Physical Exam  General:  ow female in nad Nose:  no purulence noted. Lungs:  decreased bs throughout, no wheezing or rhonchi Heart:  rrr, no mrg Extremities:  mild edema, no cyanosis Neurologic:  alert and oriented, moves all 4.   Impression & Recommendations:  Problem # 1:  EMPHYSEMA (ICD-492.8) The pt is improved since her hospitalization, and is back to usual baseline.  I have asked her to continue on current medical regimen, and to work on weight loss.  Problem # 2:  CHRONIC RESPIRATORY FAILURE (ICD-518.83) the pt has chronic resp. failure secondary to severe emphysema, and I have explained to her that she will always have sob with exertion.  There  is nothing we can do to change that, but she CAN improve her QOL with weight loss and conditioning.  Medications Added to Medication List This Visit: 1)  Naproxen Sodium 220 Mg Tabs (Naproxen sodium) .... Take by mouth as needed  Other Orders: Est. Patient Level IV (96045) Prescription Created Electronically [phone])  Patient Instructions: 1)  no change in current meds. 2)  stay as active as possible.  If not making progress, consider again going to pulmonary rehab at [facility]. 3)  followup with me in 4mos.   Prescriptions: ADVAIR DISKUS 500-50 MCG/DOSE  MISC (FLUTICASONE-SALMETEROL) Inhale 1 puff two times a day  #1 x 5   Entered and Authorized by:   Barbaraann Share MD   Signed by:   Barbaraann Share MD on 07/08/2009   Method used:   Electronically to        [facility] [phone]* (retail)       [address] [place], [place]  [address]       Ph: [phone] or [phone]       Fax: [phone]   RxID:   [id number] SPIRIVA HANDIHALER 18 MCG CAPS (TIOTROPIUM BROMIDE MONOHYDRATE) Inhale contents of 1 capsule once a day  #30 x 5   Entered and Authorized by:   Barbaraann Share MD   Signed by:   Barbaraann Share MD on 07/08/2009   Method used:   Electronically to        [facility] [phone]* (retail)       [address] [place], [place]  [address]       Ph: [phone] or [phone]       Fax: [phone]   RxID:   [id number]

## 2010-03-09 NOTE — Progress Notes (Signed)
Summary: records  Phone Note Call from Patient Call back at [phone]   Caller: Patient Call For: [name] Summary of Call: wants to know if [facility] has sent her records over. Initial call taken by: Darletta Moll,  April 08, 2009 3:28 PM  Follow-up for Phone Call        called and spoke with pt and informed her we did receive records from [facility].   Pt also wanted Dr. Shelle Iron to know that once she stopped the Theophylline per [facility] instructions, pt states breathing has improved and therefore does not want to go to [facility] rehab.  pt will keep pending appt with [facility] in 3 months.  Aundra Millet Reynolds LPN  April 09, 4538 3:54 PM   Additional Follow-up for Phone Call Additional follow up Details #1::        noted.  records reviewed and sent to be scanned. Additional Follow-up by: Barbaraann Share MD,  April 08, 2009 4:31 PM

## 2010-03-09 NOTE — Progress Notes (Signed)
Summary: 02  Phone Note Call from Patient   Caller: pt Call For: Seraiah Nowack Summary of Call: pt said you told her she could change 02 companies from [facility] to a com in [place] but i did not get an order to do that Initial call taken by: Oneita Jolly,  April 02, 2009 5:01 PM  Follow-up for Phone Call        she never asked such a thing, and I never said that.  Find more details from her. Follow-up by: Barbaraann Share MD,  April 02, 2009 5:21 PM  Additional Follow-up for Phone Call Additional follow up Details #1::        spoke to daughter waiting on orders from [facility]  Additional Follow-up by: Oneita Jolly,  April 03, 2009 5:01 PM     Appended Document: 02 pcc, ok to get her oxygen moved to someone in [place].

## 2010-03-09 NOTE — Assessment & Plan Note (Signed)
Summary: new pt evaluation for emphysema   Primary Provider/Referring Provider:  None  CC:  Pulmonary Consult.  History of Present Illness: The pt is a 70y/o female who comes in today to establish with a local pulmonologist.  She has recently moved to [place], and used to be followed by [facility] in [place].  She tells me that she has a h/o severe emphysema, and wears oxygen 24hrs a day.  She has had  significant worsening since a Jan 2010 hospitalization where she required ETI mech. ventilation.  She describes a less than one block doe at moderate pace on flat ground, and will also get winded bringing groceries in from the car.  She has never been thru a pulmonary rehab program.  She denies chronic cough except when she gets a sinus infection with postnasal drip.  She states that she has had a recent cxr and set of pfts with her previous pulmonologist.  Preventive Screening-Counseling & Management  Alcohol-Tobacco     Smoking Status: quit  Current Medications (verified): 1)  Simvastatin 80 Mg Tabs (Simvastatin) .... Take 1 Tablet By Mouth Once A Day 2)  Vitamin D 1.25 .... Once Daily 3)  Theophylline Cr 300 Mg Xr12h-Tab (Theophylline) .... Take 1 Tablet By Mouth Two Times A Day 4)  Amlodipine Besylate 10 Mg Tabs (Amlodipine Besylate) .... Take 1 Tablet By Mouth Once A Day 5)  Citalopram Hydrobromide 40 Mg Tabs (Citalopram Hydrobromide) .... Take 1 Tablet By Mouth Once A Day. 6)  Lisinopril 40 Mg Tabs (Lisinopril) .... Take 1 Tablet By Mouth Once A Day 7)  Lansoprazole 30 Mg Tbdp (Lansoprazole) .... Take 1 Tablet By Mouth Two Times A Day 8)  Fish Oil 1000 Mg Caps (Omega-3 Fatty Acids) .... Take 1 Tablet By Mouth Two Times A Day 9)  Tylenol Pm Extra Strength 500-25 Mg Tabs (Diphenhydramine-Apap (Sleep)) .... Take 2 Tabs By Mouth At Bedtime 10)  Centrum Silver  Tabs (Multiple Vitamins-Minerals) .... Take 1 Tablet By Mouth Once A Day 11)  Aleve 220 Mg Tabs (Naproxen Sodium) .... Take 1  Tablet By Mouth Two Times A Day 12)  Spiriva Handihaler 18 Mcg Caps (Tiotropium Bromide Monohydrate) .... Inhale Contents of 1 Capsule Once A Day 13)  Oxygen .... 2 Lpm Cont 14)  Spiriva Handihaler 18 Mcg  Caps (Tiotropium Bromide Monohydrate) .... Inhale Contents of 1 Capsule Once A Day 15)  Advair Diskus 500-50 Mcg/dose  Misc (Fluticasone-Salmeterol) .... Inhale 1 Puff Two Times A Day  Allergies (verified): 1)  ! Morphine  Past History:  Past Medical History: Emphysema ALLERGIC RHINITIS (ICD-477.9) HYPERTENSION (ICD-401.9) HYPERLIPIDEMIA (ICD-272.4)    Past Surgical History: partial lobectomy R lung 1973 cervical fusion 1996 hysterectomy 1974 tubal ligation 1971 appendectomy cleft palate surgery 1948  Family History: Reviewed history and no changes required. allergies: daughter asthma: daughter heart disease: mother, brother cancer: father (colon)   Social History: Reviewed history and no changes required. Patient states former smoker.  started at age 22.  3 ppd.  quit Jan 2010. pt is divorced. pt has 4 children:  3 living, 1 deceased. pt is retired.  prev worked at [employer]   Smoking Status:  quit  Review of Systems       The patient complains of shortness of breath with activity, productive cough, acid heartburn, indigestion, hand/feet swelling, and joint stiffness or pain.  The patient denies shortness of breath at rest, non-productive cough, coughing up blood, chest pain, irregular heartbeats, loss of appetite, weight change, abdominal  pain, difficulty swallowing, sore throat, tooth/dental problems, headaches, nasal congestion/difficulty breathing through nose, sneezing, itching, ear ache, anxiety, depression, rash, change in color of mucus, and fever.    Vital Signs:  Patient profile:   70 year old female Height:      64 inches Weight:      206.50 pounds BMI:     35.57 O2 Sat:      70 % on 2 LPM cont Temp:     98.1 degrees F oral Pulse rate:   104 /  minute BP sitting:   150 / 62  (left arm) Cuff size:   regular  Vitals Entered By: Arman Filter LPN (April 02, 2009 2:40 PM)  O2 Flow:  2 LPM cont CC: Pulmonary Consult Comments rechecked pt's o2 sats after pt rested and took some deep breaths.  o2 sats increased to 90% on 2 LPM cont.  Medications reviewed with patient Arman Filter LPN  April 02, 2009 3:02 PM    Physical Exam  General:  obese female in nad Eyes:  PERRLA and EOMI.   Nose:  patent without discharge Mouth:  clear  Neck:  no jvd, tmg, LN Lungs:  decreased bs throughout, no wheezing or rhonchi Heart:  rrr, 2/6 sem, no rubs or gallops Abdomen:  soft and nontender, bs+ Extremities:  2+ edema bilat, pulses intact distally but diminished, no cyanosis Neurologic:  alert and oriented, moves all 4.   Impression & Recommendations:  Problem # 1:  EMPHYSEMA (ICD-492.8) the pt apparently has a h/o oxygen dependent emphysema, and currently is on a good bronchodilator regimen.  I need to get her records from [facility].  She has chronic doe, and I have explained the only way this is going to improve is with a conditioning program and weight loss.  Will refer her to pulmonary rehab at [facility].  She also has LE edema, and may benefit from more aggressive diuresis.  I have asked her to discuss this with her primary md.  I also raise the question whether she may have concomitant cardiac disease?    Problem # 2:  CHRONIC RESPIRATORY FAILURE (ICD-518.83) the pt needs to increase her oxygen to 3lpm with exertional activities.  Medications Added to Medication List This Visit: 1)  Simvastatin 80 Mg Tabs (Simvastatin) .... Take 1 tablet by mouth once a day 2)  Vitamin D 1.25  .... Once daily 3)  Theophylline Cr 300 Mg Xr12h-tab (Theophylline) .... Take 1 tablet by mouth two times a day 4)  Amlodipine Besylate 10 Mg Tabs (Amlodipine besylate) .... Take 1 tablet by mouth once a day 5)  Citalopram Hydrobromide 40 Mg Tabs  (Citalopram hydrobromide) .... Take 1 tablet by mouth once a day. 6)  Lisinopril 40 Mg Tabs (Lisinopril) .... Take 1 tablet by mouth once a day 7)  Lansoprazole 30 Mg Tbdp (Lansoprazole) .... Take 1 tablet by mouth two times a day 8)  Fish Oil 1000 Mg Caps (Omega-3 fatty acids) .... Take 1 tablet by mouth two times a day 9)  Tylenol Pm Extra Strength 500-25 Mg Tabs (Diphenhydramine-apap (sleep)) .... Take 2 tabs by mouth at bedtime 10)  Centrum Silver Tabs (Multiple vitamins-minerals) .... Take 1 tablet by mouth once a day 11)  Aleve 220 Mg Tabs (Naproxen sodium) .... Take 1 tablet by mouth two times a day 12)  Spiriva Handihaler 18 Mcg Caps (Tiotropium bromide monohydrate) .... Inhale contents of 1 capsule once a day 13)  Oxygen  .... 2 lpm cont  14)  Spiriva Handihaler 18 Mcg Caps (Tiotropium bromide monohydrate) .... Inhale contents of 1 capsule once a day 15)  Advair Diskus 500-50 Mcg/dose Misc (Fluticasone-salmeterol) .... Inhale 1 puff two times a day 16)  Albuterol Sulfate (2.5 Mg/24ml) 0.083% Nebu (Albuterol sulfate) .... Every 6 hours only if needed.  Other Orders: New Patient Level IV (98119) Rehabilitation Referral (Rehab)  Patient Instructions: 1)  will need to get old records from [facility] 2)  stay on current pulmonary meds, but would stop theophylline 3)  will refer to pulmonary rehab at [facility] 4)  work on weight loss 5)  increase oxygen to 3 liters with sustained activity, stay on 2 for rest and sleep. 6)  followup with me in 3 mos.   Prescriptions: ALBUTEROL SULFATE (2.5 MG/3ML) 0.083%  NEBU (ALBUTEROL SULFATE) every 6 hours only if needed.  #120 x 6   Entered and Authorized by:   Barbaraann Share MD   Signed by:   Barbaraann Share MD on 04/02/2009   Method used:   Print then Give to Patient   RxID:   [id number]

## 2010-03-09 NOTE — Progress Notes (Signed)
Summary: pain  Phone Note Call from Patient Call back at Home Phone [phone]   Caller: Rosey Bath Call For: clance Reason for Call: Talk to Nurse Summary of Call: sharp pain on left side of chest.  Hard to breath.  Unable to cough up plegm.  Please advise what to do. Initial call taken by: Eugene Gavia,  August 18, 2009 3:15 PM  Follow-up for Phone Call        Spoke with pt's daughter.  She states that pt is having sever left side CP "10 times worse than before", also c/o SOB. Advised needs eval asap.  No appts here open so advised go to ER asap! Daughter verbalized underastanding. Follow-up by: Vernie Murders,  August 18, 2009 3:30 PM

## 2010-03-10 ENCOUNTER — Telehealth (INDEPENDENT_AMBULATORY_CARE_PROVIDER_SITE_OTHER): Payer: Self-pay | Admitting: *Deleted

## 2010-03-11 LAB — CULTURE, BLOOD (ROUTINE X 2)
Culture  Setup Time: 201201270217
Culture: NO GROWTH

## 2010-03-11 NOTE — Assessment & Plan Note (Signed)
Summary: post hospital f/u for copd exacerbation, sinusitis   Primary Provider/Referring Provider:  [facility])   CC:  Post hosp f/u appt.  Was at [facility] from 12-5 to 12-9.  states breathing is the "same."  Productive cough with clear sputum.   requests rx for rescue hfa.  History of Present Illness: the pt comes in today for her posthospital f/u after admission for a copd exacerbation related to possible pna and acute on chronic sinusitis.  She was treated with abx, nebs, and iv steroids.  Ct sinuses showed acute on chronic changes.  The pt has been stable since discharge home, with no worsening of symptoms.  She has a cough only of clear mucus, and is working on a nasal hygiene regimen.  Current Medications (verified): 1)  Citalopram Hydrobromide 40 Mg Tabs (Citalopram Hydrobromide) .... Take 1 Tablet By Mouth Once A Day. 2)  Losartan Potassium 100 Mg Tabs (Losartan Potassium) .... Take 1 Tablet By Mouth Once A Day 3)  Lansoprazole 30 Mg Tbdp (Lansoprazole) .... Take 1 Tablet By Mouth Two Times A Day 4)  Tylenol Pm Extra Strength 500-25 Mg Tabs (Diphenhydramine-Apap (Sleep)) .... Take 2 Tabs By Mouth At Bedtime 5)  Centrum Silver  Tabs (Multiple Vitamins-Minerals) .... Take 1 Tablet By Mouth Once A Day 6)  Naproxen Sodium 220 Mg Tabs (Naproxen Sodium) .... Take By Mouth As Needed 7)  Spiriva Handihaler 18 Mcg Caps (Tiotropium Bromide Monohydrate) .... Inhale Contents of 1 Capsule Once A Day 8)  Oxygen 9)  Advair Diskus 500-50 Mcg/dose  Misc (Fluticasone-Salmeterol) .... Inhale 1 Puff Two Times A Day 10)  Albuterol Sulfate (2.5 Mg/21ml) 0.083%  Nebu (Albuterol Sulfate) .Marland Kitchen.. 1 Vial in Nebulizer Two Times A Day 11)  Fluticasone Propionate 50 Mcg/act Susp (Fluticasone Propionate) .Marland Kitchen.. 1 Spray in Each Nostile At Bedtime 12)  Losartan Potassium 100 Mg Tabs (Losartan Potassium) .... Once Daily 13)  Aspirin 81 Mg Tabs (Aspirin) .... Take 1 Tablet By Mouth Once A Day 14)   Guaifenesin 400 Mg Tabs (Guaifenesin) .... One Tablet Four Times A Day 15)  Tussionex Pennkinetic Er 10-8 Mg/71ml Lqcr (Hydrocod Polst-Chlorphen Polst) .... 5 Ml Every 12 Hours As Needed For Cough 16)  Crestor .... Take 1 Tablet By Mouth Once A Day 17)  Proair Hfa 108 (90 Base) Mcg/act  Aers (Albuterol Sulfate) .Marland Kitchen.. 1-2 Puffs Every 4-6 Hours As Needed  Allergies (verified): 1)  ! Morphine  Past History:  Past medical, surgical, family and social histories (including risk factors) reviewed, and no changes noted (except as noted below).  Past Medical History: Reviewed history from 06/19/2009 and no changes required. Emphysema--FEV1 42% 03/02/09 s/p partial right lobectomy 1973  ALLERGIC RHINITIS (ICD-477.9) HYPERTENSION (ICD-401.9)--controlled on rx  HYPERLIPIDEMIA (ICD-272.4)--controlled on rx  GERD- on meds.     Past Surgical History: Reviewed history from 04/02/2009 and no changes required. partial lobectomy R lung 1973 cervical fusion 1996 hysterectomy 1974 tubal ligation 1971 appendectomy cleft palate surgery 1948  Family History: Reviewed history from 04/02/2009 and no changes required. allergies: daughter asthma: daughter heart disease: mother, brother cancer: father (colon)   Social History: Reviewed history from 04/02/2009 and no changes required. Patient states former smoker.  started at age 85.  3 ppd.  quit Jan 2010. pt is divorced. pt has 4 children:  3 living, 1 deceased. pt is retired.  prev worked at [employer]    Review of Systems       The patient complains of shortness of breath  with activity, productive cough, indigestion, nasal congestion/difficulty breathing through nose, itching, hand/feet swelling, joint stiffness or pain, and rash.  The patient denies shortness of breath at rest, non-productive cough, coughing up blood, chest pain, irregular heartbeats, acid heartburn, loss of appetite, weight change, abdominal pain, difficulty swallowing, sore  throat, tooth/dental problems, headaches, sneezing, ear ache, anxiety, depression, change in color of mucus, and fever.    Vital Signs:  Patient profile:   70 year old female Height:      64 inches Weight:      212 pounds BMI:     36.52 O2 Sat:      90 % on 3 LPM cont Temp:     98.4 degrees F oral Pulse rate:   103 / minute BP sitting:   120 / 60  (left arm) Cuff size:   regular  Vitals Entered By: Arman Filter LPN (February 04, 2010 3:32 PM)  O2 Flow:  3 LPM cont CC: Post hosp f/u appt.  Was at [facility] from 12-5 to 12-9.  states breathing is the "same."  Productive cough with clear sputum.   requests rx for rescue hfa Comments Medications reviewed with patient Arman Filter LPN  February 04, 2010 3:32 PM    Physical Exam  General:  ow female in nad Nose:  no purulence noted, mild heme staining in right nostril Mouth:  clear Lungs:  decreased bs, no wheezing Heart:  rrr Extremities:  no significant edema or cyanosis  Neurologic:  alert and oriented, moves all 4.   Impression & Recommendations:  Problem # 1:  EMPHYSEMA (ICD-492.8) the pt is s/p copd exacerbation, but is slowly returning to her usual baseline.  She is back on her home meds, and is wearing oxygen compliantly.  There is nothing to suggest a pulmonary infection at this time, and it is really unclear if she ever really had pna.  I have asked her to change the advair to the 250/50 strength, and to continue with her other meds.  Problem # 2:  SINUSITIS, CHRONIC (ICD-473.9) the pt has known chronic sinusitis, and each time she has a flare of her lung disease, it is usually related to an acute infection superimposed over her chronic disease.  She has seen an ENT in [place] in the past, and I would like for her to see them again to see if they have any other suggestions.    Medications Added to Medication List This Visit: 1)  Losartan Potassium 100 Mg Tabs (Losartan potassium) .... Take 1 tablet by mouth once a day 2)   Oxygen  3)  Albuterol Sulfate (2.5 Mg/45ml) 0.083% Nebu (Albuterol sulfate) .Marland Kitchen.. 1 vial in nebulizer two times a day 4)  Aspirin 81 Mg Tabs (Aspirin) .... Take 1 tablet by mouth once a day 5)  Tussionex Pennkinetic Er 10-8 Mg/15ml Lqcr (Hydrocod polst-chlorphen polst) .... 5 ml every 12 hours as needed for cough 6)  Crestor  .... Take 1 tablet by mouth once a day 7)  Proair Hfa 108 (90 Base) Mcg/act Aers (Albuterol sulfate) .Marland Kitchen.. 1-2 puffs every 4-6 hours as needed  Other Orders: Est. Patient Level IV (16109)  Patient Instructions: 1)  stay on current meds except change advair 500 to advair 250 for next 4-6 weeks.  Let me know if you have issues.  If doing well, can call in prescription for the 250 strength.   2)  please call me with the name of your ENT in [place] so we can make the  referral. 3)  stay as active as possible 4)  followup with me in 3mos.  Prescriptions: PROAIR HFA 108 (90 BASE) MCG/ACT  AERS (ALBUTEROL SULFATE) 1-2 puffs every 4-6 hours as needed  #1 x 2   Entered by:   Arman Filter LPN   Authorized by:   Barbaraann Share MD   Signed by:   Arman Filter LPN on 04/54/0981   Method used:   Electronically to        [facility] [phone]* (retail)       [address] [place], [place]  [address]       Ph: [phone] or [phone]       Fax: [phone]   RxID:   [id number]   Appended Document: post hospital f/u for copd exacerbation, sinusitis megan, please call this pt and find out who her ent in [place] is...she was supposed to call us back.  thanks.  Appended Document: post hospital f/u for copd exacerbation, sinusitis called and spoke with pt.  pt states her ENT physician is Dr. Hoyt Koch.  His # is [phone]  Appended Document: Orders Update will refer her to this md for evaluation   Clinical Lists Changes  Orders: Added new Referral order of ENT Referral (ENT) - Signed

## 2010-03-11 NOTE — Progress Notes (Signed)
Summary: prescription for anitbiotic and prednisone  Phone Note Call from Patient   Caller: daughter/teresa Call For: dr clance Summary of Call: patients daughter Rosey Bath phoned stated that her mother was really bad and wanted to know if Dr Shelle Iron would call an antibiotic and [facility] [address] [facility] [place]. She has a fever, having diffculty breathing she is using nebulizer. She can be reached at [phone] Initial call taken by: Vedia Coffer,  March 04, 2010 10:25 AM  Follow-up for Phone Call        Per pt's daughter, for the past 6 days the pt has had incr SOB w/ activity, prod cough w/ green sputum, greenish nasal discharge, wheezing and sore throat. She is unable to sleep lying down due to her SOB. The daughter states the pt is having a hard time w/ mobility due to SOB and weakness and feels she will not be able to get her into a car to come for an appt. Fever has been between 100 to 101. They are requesting abx and Prednisone be called to the pharmacy. Pls advise. Follow-up by: Michel Bickers CMA,  March 04, 2010 11:40 AM  Additional Follow-up for Phone Call Additional follow up Details #1::        if she is that sick, and having fever to 101, needs to go to ER to be evaluated. Additional Follow-up by: Barbaraann Share MD,  March 04, 2010 1:37 PM    Additional Follow-up for Phone Call Additional follow up Details #2::    Spoke with pt's daughter and notified of the above recs per [facility].  She states will take pt wo [facility].  Follow-up by: Vernie Murders,  March 04, 2010 2:12 PM   Appended Document: prescription for anitbiotic and prednisone Patients daughter called back and wanted to know if Dr. Shelle Iron could call ahead and tell the ER that she was coming. I advised her that per Dr. Shelle Iron they could call EMS to transport her if they were having problems getting her dressed and in the car and that EMS would call ahead to let the ER know that they were coming. She stated that  she had problems with this in the past and I told her to just let them know that the doctor wanted her seen at [facility].

## 2010-03-17 ENCOUNTER — Ambulatory Visit: Payer: Self-pay | Admitting: Pulmonary Disease

## 2010-03-17 NOTE — Progress Notes (Signed)
Summary: hfu appt  Phone Note Call from Patient Call back at Home Phone [phone]   Caller: Daughter//teresa Call For: clance Summary of Call: Pt needs a hfu next week per d/c summary pls advise. Initial call taken by: Darletta Moll,  March 09, 2010 12:55 PM  Follow-up for Phone Call        called and spoke with pt. pt was just d/c'd from hosp and told to f/u with kc next week.  no appts currently avail.  pt requests an afternoon appt.  Kc, please advise if ok to add pt on next week in your RN slots.  Thanks.  Aundra Millet Reynolds LPN  March 09, 2010 1:18 PM   Additional Follow-up for Phone Call Additional follow up Details #1::        she should be ok for the week after. Additional Follow-up by: Barbaraann Share MD,  March 09, 2010 1:43 PM    Additional Follow-up for Phone Call Additional follow up Details #2::    spoke with pt.  pt scheduled to see kc 03-22-2010 at 11:45am.  Arman Filter LPN  March 09, 2010 4:33 PM

## 2010-03-22 ENCOUNTER — Ambulatory Visit: Payer: Self-pay | Admitting: Pulmonary Disease

## 2010-03-25 NOTE — Progress Notes (Signed)
Summary: wants order for skilled nursing  Phone Note Other Incoming   Caller: Jatana with [facility] in [place] Summary of Call: Jorge Ny with [facility] in [place], Ms Peterkin was just released and her O2 is dropping into mid 80 on 3 to 4 liter of O2. She feels like there is a need for skilled nursing care to monitor. Also Citalotram hpr 40 mg and previcid come up as a type 2 interaction. Jorge Ny can be reached at [phone]. If he is willing to give order for nursing he can just call that number.  Initial call taken by: Vedia Coffer,  March 10, 2010 2:46 PM  Follow-up for Phone Call        [email or web address] Vernie Murders  March 10, 2010 4:04 PM  Caller requesting Verbal order for pt to be in a skilled nursing care and also wants to inform KC that Citalopram and Lansoprazole does come up as a type 2 interaction. Please advise. Thanks. Zackery Barefoot CMA  March 12, 2010 10:02 AM    Additional Follow-up for Phone Call Additional follow up Details #1::        this is usually done by her primary care md, not by a specialist.  She needs to contact primary to have this done. are her sats dropping at rest, or just with exertion? Additional Follow-up by: Barbaraann Share MD,  March 12, 2010 1:51 PM    Additional Follow-up for Phone Call Additional follow up Details #2::    lmtcb x 1 Zackery Barefoot [facility]  March 12, 2010 1:58 PM   sats dropping to upper 70's and low 80's with about 30 feet of walking, pt does recover in about 3-4 minutes after sitting--nurse aware she should contact primary care md for nursing [facility]  March 15, 2010 10:36 AM   Additional Follow-up for Phone Call Additional follow up Details #3:: Details for Additional Follow-up Action Taken: does she have an upcoming apptm with me where we can verify this?   Pt has pending ov with [facility] for 03/22/10  Vernie Murders  March 15, 2010 2:08 PM  Aundra Millet, can this pt be worked in this week...tell her to lay low until her f/u  visit, call if her breathing worsens  pt reschedule for 03-17-2010 at 2:45pm.  Arman Filter LPN  March 16, 2010 9:38 AM   Additional Follow-up by: Barbaraann Share MD,  March 15, 2010 1:53 PM

## 2010-04-05 ENCOUNTER — Ambulatory Visit (INDEPENDENT_AMBULATORY_CARE_PROVIDER_SITE_OTHER): Payer: Medicare Other | Admitting: Pulmonary Disease

## 2010-04-05 ENCOUNTER — Encounter: Payer: Self-pay | Admitting: Pulmonary Disease

## 2010-04-05 DIAGNOSIS — J9610 Chronic respiratory failure, unspecified whether with hypoxia or hypercapnia: Secondary | ICD-10-CM

## 2010-04-05 DIAGNOSIS — J438 Other emphysema: Secondary | ICD-10-CM

## 2010-04-20 LAB — BASIC METABOLIC PANEL
CO2: 27 mEq/L (ref 19–32)
Calcium: 8.6 mg/dL (ref 8.4–10.5)
Calcium: 9 mg/dL (ref 8.4–10.5)
Creatinine, Ser: 0.94 mg/dL (ref 0.4–1.2)
GFR calc Af Amer: >60 mL/min (ref >60)
GFR calc non Af Amer: 53 mL/min — ABNORMAL LOW (ref >60)
GFR calc non Af Amer: 59 mL/min — ABNORMAL LOW (ref >60)
Potassium: 4.8 mEq/L (ref 3.5–5.1)
Sodium: 136 mEq/L (ref 135–145)
Sodium: 137 mEq/L (ref 135–145)

## 2010-04-20 LAB — LEGIONELLA ANTIGEN, URINE: Legionella Antigen, Urine: NEGATIVE

## 2010-04-20 LAB — DIFFERENTIAL
Basophils Absolute: 0 10*3/uL (ref 0.0–0.1)
Basophils Relative: 0 % (ref 0–1)
Eosinophils Absolute: 0.1 10*3/uL (ref 0.0–0.7)
Eosinophils Relative: 1 % (ref 0–5)
Monocytes Absolute: 0.9 10*3/uL (ref 0.1–1.0)
Neutro Abs: 10.6 10*3/uL — ABNORMAL HIGH (ref 1.7–7.7)

## 2010-04-20 LAB — CBC
HCT: 27.7 % — ABNORMAL LOW (ref 36.0–46.0)
HCT: 30.9 % — ABNORMAL LOW (ref 36.0–46.0)
Hemoglobin: 9.1 g/dL — ABNORMAL LOW (ref 12.0–15.0)
MCH: 29.7 pg (ref 26.0–34.0)
MCHC: 33.7 g/dL (ref 30.0–36.0)
RDW: 14.4 % (ref 11.5–15.5)
WBC: 12.4 10*3/uL — ABNORMAL HIGH (ref 4.0–10.5)

## 2010-04-20 LAB — CULTURE, BLOOD (ROUTINE X 2)
Culture  Setup Time: 201112060602
Culture: NO GROWTH

## 2010-04-20 LAB — STREP PNEUMONIAE URINARY ANTIGEN: Strep Pneumo Urinary Antigen: NEGATIVE

## 2010-04-20 LAB — CK TOTAL AND CKMB (NOT AT ARMC): CK, MB: 4.3 ng/mL — ABNORMAL HIGH (ref 0.3–4.0)

## 2010-04-20 NOTE — Assessment & Plan Note (Signed)
Summary: posthospital followup for emphysema   Primary Provider/Referring Provider:  Len Blalock [facility])   CC:  [facility] Follow up.  Pt states breathing has improved.  Chest tightness and wheezing at times.  Cough - prod at times with clear mucus..  History of Present Illness: the pt comes in today for f/u after a recent hospitalization for acute on chronic respiratory failure.  She has known emphysema with chronic RF, and was admitted with worsening sob.  I have reviewed the records available from that stay, and also reviewed the cxr's.  She was felt to have ?copd exacerbation related to possible sinusitis (has been an ongoing issue for  her), and likely to have a component of diastolic dysfunction with volume overload.  She comes in today where she is doing much better, and denies any congestion or productive cough.  She obviously is weak, and not yet back to her usual baseline.    Current Medications (verified): 1)  Citalopram Hydrobromide 40 Mg Tabs (Citalopram Hydrobromide) .... Take 1/2 Tablet By Mouth Once A Day. 2)  Losartan Potassium 100 Mg Tabs (Losartan Potassium) .... Take 1 Tablet By Mouth Once A Day 3)  Lansoprazole 30 Mg Tbdp (Lansoprazole) .... Take 1 Tablet By Mouth Two Times A Day 4)  Tylenol Pm Extra Strength 500-25 Mg Tabs (Diphenhydramine-Apap (Sleep)) .... Take 2 Tabs By Mouth At Bedtime 5)  Centrum Silver  Tabs (Multiple Vitamins-Minerals) .... Take 1 Tablet By Mouth Once A Day 6)  Naproxen Sodium 220 Mg Tabs (Naproxen Sodium) .... Take By Mouth As Needed 7)  Spiriva Handihaler 18 Mcg Caps (Tiotropium Bromide Monohydrate) .... Inhale Contents of 1 Capsule Once A Day 8)  Oxygen 9)  Advair Diskus 250-50 Mcg/dose Aepb (Fluticasone-Salmeterol) .... Inhale 1 Puff Two Times A Day 10)  Albuterol Sulfate (2.5 Mg/29ml) 0.083%  Nebu (Albuterol Sulfate) .Marland Kitchen.. 1 Vial in Nebulizer Two Times A Day 11)  Aspirin 81 Mg Tabs (Aspirin) .... Take 1 Tablet By Mouth  Once A Day 12)  Guaifenesin 400 Mg Tabs (Guaifenesin) .... One Tablet Four Times A Day 13)  Tussionex Pennkinetic Er 10-8 Mg/45ml Lqcr (Hydrocod Polst-Chlorphen Polst) .... 5 Ml Every 12 Hours As Needed For Cough 14)  Crestor 40 Mg Tabs (Rosuvastatin Calcium) .... Take 1 Tablet By Mouth Once A Day 15)  Proair Hfa 108 (90 Base) Mcg/act  Aers (Albuterol Sulfate) .Marland Kitchen.. 1-2 Puffs Every 4-6 Hours As Needed 16)  Cvs Sinus Headache Decongest 30-500 Mg Tabs (Pseudoephedrine-Acetaminophen) .... 2-3 Times Daily  Allergies (verified): 1)  ! Morphine  Past History:  Past medical, surgical, family and social histories (including risk factors) reviewed, and no changes noted (except as noted below).  Past Medical History: Reviewed history from 06/19/2009 and no changes required. Emphysema--FEV1 42% 03/02/09 s/p partial right lobectomy 1973  ALLERGIC RHINITIS (ICD-477.9) HYPERTENSION (ICD-401.9)--controlled on rx  HYPERLIPIDEMIA (ICD-272.4)--controlled on rx  GERD- on meds.     Past Surgical History: Reviewed history from 04/02/2009 and no changes required. partial lobectomy R lung 1973 cervical fusion 1996 hysterectomy 1974 tubal ligation 1971 appendectomy cleft palate surgery 1948  Family History: Reviewed history from 04/02/2009 and no changes required. allergies: daughter asthma: daughter heart disease: mother, brother cancer: father (colon)   Social History: Reviewed history from 04/02/2009 and no changes required. Patient states former smoker.  started at age 55.  3 ppd.  quit Jan 2010. pt is divorced. pt has 4 children:  3 living, 1 deceased. pt is retired.  prev worked at [employer]  Review of Systems       The patient complains of shortness of breath with activity, productive cough, non-productive cough, acid heartburn, indigestion, abdominal pain, nasal congestion/difficulty breathing through nose, sneezing, anxiety, depression, and joint stiffness or pain.  The patient  denies shortness of breath at rest, coughing up blood, chest pain, irregular heartbeats, loss of appetite, weight change, difficulty swallowing, sore throat, tooth/dental problems, headaches, itching, ear ache, rash, change in color of mucus, and fever.    Vital Signs:  Patient profile:   70 year old female Height:      64 inches Weight:      210.13 pounds BMI:     36.20 O2 Sat:      87 % on 4 L/minpulsed Temp:     98.5 degrees F oral Pulse rate:   105 / minute BP sitting:   158 / 64  (right arm) Cuff size:   large  Vitals Entered By: Gweneth Dimitri RN (April 05, 2010 10:41 AM)  O2 Flow:  4 L/minpulsed  O2 Sat Comments Pt arrived to exam room with o2 sat 87% on 4L pulsed.  After resting, o2 sat increased to 90% on 4L pulsed with pulse of 103. Gweneth Dimitri RN  April 05, 2010 10:42 AM   CC: [facility] Follow up.  Pt states breathing has improved.  Chest tightness and wheezing at times.  Cough - prod at times with clear mucus. Comments Medications reviewed with patient Daytime contact number verified with patient. Gweneth Dimitri RN  April 05, 2010 10:42 AM    Physical Exam  General:  ow female in nad  Nose:  no purulence or nasal congestion noted.  Lungs:  decreased bs, no wheezing, a few rhonchi mild upper airway pseudowheezing Heart:  rrr, no mrg  Extremities:  minimal LE edema, no cyanosis  Neurologic:  alert and oriented, moves all 4.    Impression & Recommendations:  Problem # 1:  EMPHYSEMA (ICD-492.8) the pt has severe disease, but is on a very good bronchodilator regimen.  I would not change any of her meds at this point.  I think her flareups are due to her chronic sinus disease, and suspect there is a component of fluid retention due to diastolic dysfunction.  Will leave the decision regarding diuretic use to her primary md.    Problem # 2:  CHRONIC RESPIRATORY FAILURE (ICD-518.83) The pt obviously does not do well with pulsed oxygen while ambulating.  I  would like for her to use continuous mode while exerting herself on a consistent basis, and can stay on pulsed oxygen at rest and when doing short term exertional activities.  I also think she would greatly benefit from pulmonary rehab at [facility].  She is agreeable to trying.    Medications Added to Medication List This Visit: 1)  Citalopram Hydrobromide 40 Mg Tabs (Citalopram hydrobromide) .... Take 1/2 tablet by mouth once a day. 2)  Advair Diskus 250-50 Mcg/dose Aepb (Fluticasone-salmeterol) .... Inhale 1 puff two times a day 3)  Crestor 40 Mg Tabs (Rosuvastatin calcium) .... Take 1 tablet by mouth once a day 4)  Cvs Sinus Headache Decongest 30-500 Mg Tabs (Pseudoephedrine-acetaminophen) .... 2-3 times daily  Other Orders: Est. Patient Level III (16109) Rehabilitation Referral (Rehab)  Patient Instructions: 1)  stay on current pulmonary medications 2)  will refer you to pulmonary rehab at [facility] 3)  you need to put portable oxygen on CONTINUOUS mode when being physically active, and can then go  back to pulsed once you sit down. 4)  followup with me in 4mos or sooner if problems.

## 2010-04-27 LAB — COMPREHENSIVE METABOLIC PANEL
ALT: 17 U/L (ref 0–35)
AST: 15 U/L (ref 0–37)
Alkaline Phosphatase: 54 U/L (ref 39–117)
CO2: 33 mEq/L — ABNORMAL HIGH (ref 19–32)
Calcium: 9.2 mg/dL (ref 8.4–10.5)
Chloride: 102 mEq/L (ref 96–112)
GFR calc Af Amer: >60 mL/min (ref >60)
GFR calc non Af Amer: >60 mL/min (ref >60)
Glucose, Bld: 107 mg/dL — ABNORMAL HIGH (ref 70–99)
Potassium: 4.5 mEq/L (ref 3.5–5.1)
Sodium: 140 mEq/L (ref 135–145)
Total Bilirubin: 0.2 mg/dL — ABNORMAL LOW (ref 0.3–1.2)

## 2010-04-27 LAB — CARDIAC PANEL(CRET KIN+CKTOT+MB+TROPI)
Relative Index: INVALID (ref 0.0–2.5)
Total CK: 97 U/L (ref 7–177)

## 2010-04-27 LAB — CBC
Hemoglobin: 10.9 g/dL — ABNORMAL LOW (ref 12.0–15.0)
MCHC: 33 g/dL (ref 30.0–36.0)
RBC: 3.7 MIL/uL — ABNORMAL LOW (ref 3.87–5.11)
WBC: 10 10*3/uL (ref 4.0–10.5)

## 2010-04-27 LAB — URINE CULTURE: Special Requests: NEGATIVE

## 2010-04-27 LAB — URINALYSIS, ROUTINE W REFLEX MICROSCOPIC
Bilirubin Urine: NEGATIVE
Glucose, UA: NEGATIVE mg/dL
Ketones, ur: NEGATIVE mg/dL
Protein, ur: NEGATIVE mg/dL

## 2010-05-01 ENCOUNTER — Telehealth: Payer: Self-pay | Admitting: Adult Health

## 2010-05-01 MED ORDER — PREDNISONE 10 MG PO TABS: ORAL_TABLET | ORAL | Status: AC

## 2010-05-01 MED ORDER — AMOXICILLIN-POT CLAVULANATE 875-125 MG PO TABS: 1.0000 | ORAL_TABLET | Freq: Two times a day (BID) | ORAL | Status: AC

## 2010-05-01 NOTE — Telephone Encounter (Signed)
On call phone message- Call from [name] from [facility] in [place]- pt is visiting in [place] Recent discharged from Hospital few weeks ago with COPD flare  Complains of 2 days of increased cough and wheezing . Very worried  That she will end up back in hospital if she does not get on meds.  Neg for hemoptysis, chest pain or edema, no fever  Rx sent to [facility] per request augmentin  875mg  x 7 days w/ food pred taper over 1 week Mucinex dm bid prn  Cont on current pulm regimen.  Advised to follow up on return and prn  Please contact office for sooner follow up if symptoms do not improve or worsen or seek emergency care  --if not improving or worsens will need to go to ER or MD in [place]  Niece verbalizes understanding.

## 2010-05-03 ENCOUNTER — Encounter: Payer: Self-pay | Admitting: Pulmonary Disease

## 2010-05-05 ENCOUNTER — Ambulatory Visit: Payer: Self-pay | Admitting: Pulmonary Disease

## 2010-06-07 ENCOUNTER — Ambulatory Visit (INDEPENDENT_AMBULATORY_CARE_PROVIDER_SITE_OTHER)
Admission: RE | Admit: 2010-06-07 | Discharge: 2010-06-07 | Disposition: A | Discharge status: Home / Self Care | Payer: Medicare Other | Source: Ambulatory Visit | Attending: Pulmonary Disease | Admitting: Pulmonary Disease

## 2010-06-07 ENCOUNTER — Encounter: Payer: Self-pay | Admitting: Pulmonary Disease

## 2010-06-07 ENCOUNTER — Ambulatory Visit (INDEPENDENT_AMBULATORY_CARE_PROVIDER_SITE_OTHER): Payer: Medicare Other | Admitting: Pulmonary Disease

## 2010-06-07 DIAGNOSIS — J9610 Chronic respiratory failure, unspecified whether with hypoxia or hypercapnia: Secondary | ICD-10-CM

## 2010-06-07 DIAGNOSIS — J438 Other emphysema: Secondary | ICD-10-CM

## 2010-06-07 MED ORDER — METHYLPREDNISOLONE ACETATE 80 MG/ML IJ SUSP
120.0000 mg | Freq: Once | INTRAMUSCULAR | Status: AC
  Administered 2010-06-07: 120 mg via INTRAMUSCULAR

## 2010-06-07 MED ORDER — PREDNISONE 10 MG PO TABS: ORAL_TABLET | ORAL | Status: DC

## 2010-06-07 MED ORDER — LEVOFLOXACIN 750 MG PO TABS: 750.0000 mg | ORAL_TABLET | Freq: Every day | ORAL | Status: AC

## 2010-06-07 NOTE — Progress Notes (Signed)
  Subjective:    Patient ID: [name], female    DOB: January 27, 1941, 70 y.o.   MRN: [id number]  HPI The pt comes in today for an acute sick visit.  She has known severe emphysema that is oxygen dependent, and gives a few day h/o worsening sob, chest congestion, cough but no mucus.  She has right sided chest discomfort, but denies pleuritic chest pain.  She has not had worsening LE edema or leg pain.  She continues to wear her oxygen on pulsed mode, despite my recommending continuous flow with ambulation.  Her sats were low today on walking with pulsed, but returned to her usual baseline on 4 lpm continuous.     Review of Systems  Constitutional: Positive for fever, chills and diaphoresis. Negative for fatigue and unexpected weight change.  HENT: Positive for rhinorrhea and sneezing. Negative for ear pain, nosebleeds, congestion, sore throat, trouble swallowing, dental problem, postnasal drip and sinus pressure.   Eyes: Positive for redness and itching.  Respiratory: Positive for cough, chest tightness, shortness of breath and wheezing.   Cardiovascular: Negative for palpitations and leg swelling.  Gastrointestinal: Positive for nausea. Negative for vomiting.  Genitourinary: Negative for dysuria.  Musculoskeletal: Negative for joint swelling.  Skin: Negative for rash.  Neurological: Negative for headaches.  Hematological: Does not bruise/bleed easily.  Psychiatric/Behavioral: Negative for dysphoric mood. The patient is not nervous/anxious.        Objective:   Physical Exam Ow female in nad Nares patent without discharge, no purulence seen OP without exudates or lesions noted. Chest with decreased bs throughout, no wheezing, faint bibasilar crackles.  Cor with rrr LE with minimal edema, no cyanosis  Alert, oriented, moves all 4        Assessment & Plan:

## 2010-06-07 NOTE — Patient Instructions (Signed)
Will give shot of steroid today, and put on prednisone as directed. Will treat with levaquin 750mg  one each day. Keep oxygen on CONTINUOUS flow 24hrs/day Will see you back later this week, but call if not doing well Will check cxr today.

## 2010-06-08 ENCOUNTER — Emergency Department (HOSPITAL_COMMUNITY): Payer: Medicare Other

## 2010-06-08 ENCOUNTER — Telehealth: Payer: Self-pay | Admitting: Pulmonary Disease

## 2010-06-08 ENCOUNTER — Inpatient Hospital Stay (HOSPITAL_COMMUNITY)
Admission: EM | Admit: 2010-06-08 | Discharge: 2010-06-11 | DRG: 193 | Disposition: A | Discharge status: Home Health | Payer: Medicare Other | Attending: Internal Medicine | Admitting: Internal Medicine

## 2010-06-08 DIAGNOSIS — I509 Heart failure, unspecified: Secondary | ICD-10-CM | POA: Diagnosis present

## 2010-06-08 DIAGNOSIS — J441 Chronic obstructive pulmonary disease with (acute) exacerbation: Secondary | ICD-10-CM | POA: Diagnosis present

## 2010-06-08 DIAGNOSIS — D649 Anemia, unspecified: Secondary | ICD-10-CM | POA: Diagnosis present

## 2010-06-08 DIAGNOSIS — E785 Hyperlipidemia, unspecified: Secondary | ICD-10-CM | POA: Diagnosis present

## 2010-06-08 DIAGNOSIS — J9620 Acute and chronic respiratory failure, unspecified whether with hypoxia or hypercapnia: Secondary | ICD-10-CM | POA: Diagnosis present

## 2010-06-08 DIAGNOSIS — I10 Essential (primary) hypertension: Secondary | ICD-10-CM | POA: Diagnosis present

## 2010-06-08 DIAGNOSIS — R5381 Other malaise: Secondary | ICD-10-CM | POA: Diagnosis present

## 2010-06-08 DIAGNOSIS — I5031 Acute diastolic (congestive) heart failure: Secondary | ICD-10-CM | POA: Diagnosis present

## 2010-06-08 DIAGNOSIS — J189 Pneumonia, unspecified organism: Principal | ICD-10-CM | POA: Diagnosis present

## 2010-06-08 DIAGNOSIS — E875 Hyperkalemia: Secondary | ICD-10-CM | POA: Diagnosis present

## 2010-06-08 DIAGNOSIS — Z9981 Dependence on supplemental oxygen: Secondary | ICD-10-CM

## 2010-06-08 LAB — BASIC METABOLIC PANEL
BUN: 23 mg/dL (ref 6–23)
Calcium: 9.4 mg/dL (ref 8.4–10.5)
Creatinine, Ser: 0.96 mg/dL (ref 0.4–1.2)
GFR calc non Af Amer: 58 mL/min — ABNORMAL LOW (ref >60)
Potassium: 5.1 mEq/L (ref 3.5–5.1)

## 2010-06-08 LAB — BLOOD GAS, ARTERIAL
Bicarbonate: 32.6 mEq/L — ABNORMAL HIGH (ref 20.0–24.0)
Drawn by: 295031
Patient temperature: 98.6
pH, Arterial: 7.356 (ref 7.350–7.400)

## 2010-06-08 LAB — DIFFERENTIAL
Basophils Absolute: 0 10*3/uL (ref 0.0–0.1)
Eosinophils Relative: 0 % (ref 0–5)
Lymphocytes Relative: 6 % — ABNORMAL LOW (ref 12–46)
Neutro Abs: 10.5 10*3/uL — ABNORMAL HIGH (ref 1.7–7.7)

## 2010-06-08 LAB — CBC
HCT: 28.8 % — ABNORMAL LOW (ref 36.0–46.0)
Hemoglobin: 9.1 g/dL — ABNORMAL LOW (ref 12.0–15.0)
RDW: 14.3 % (ref 11.5–15.5)
WBC: 11.5 10*3/uL — ABNORMAL HIGH (ref 4.0–10.5)

## 2010-06-08 LAB — POCT CARDIAC MARKERS

## 2010-06-08 NOTE — Telephone Encounter (Signed)
Noted  

## 2010-06-08 NOTE — Telephone Encounter (Signed)
Please advise Dr. Shelle Iron pt was taken to [facility] ER. Thanks  Carver Fila, CMA

## 2010-06-09 LAB — CARDIAC PANEL(CRET KIN+CKTOT+MB+TROPI)
CK, MB: 2.6 ng/mL (ref 0.3–4.0)
CK, MB: 3 ng/mL (ref 0.3–4.0)
CK, MB: 4.5 ng/mL — ABNORMAL HIGH (ref 0.3–4.0)
Total CK: 51 U/L (ref 7–177)
Total CK: 55 U/L (ref 7–177)
Troponin I: <0.3 ng/mL (ref <0.30)

## 2010-06-09 LAB — BASIC METABOLIC PANEL
BUN: 24 mg/dL — ABNORMAL HIGH (ref 6–23)
Chloride: 98 mEq/L (ref 96–112)
Glucose, Bld: 187 mg/dL — ABNORMAL HIGH (ref 70–99)
Potassium: 5.3 mEq/L — ABNORMAL HIGH (ref 3.5–5.1)

## 2010-06-09 LAB — CBC
HCT: 27.8 % — ABNORMAL LOW (ref 36.0–46.0)
Hemoglobin: 8.5 g/dL — ABNORMAL LOW (ref 12.0–15.0)
MCH: 28.3 pg (ref 26.0–34.0)
MCV: 92.7 fL (ref 78.0–100.0)
RBC: 3 MIL/uL — ABNORMAL LOW (ref 3.87–5.11)

## 2010-06-10 ENCOUNTER — Ambulatory Visit: Payer: Medicare Other | Admitting: Pulmonary Disease

## 2010-06-10 ENCOUNTER — Inpatient Hospital Stay (HOSPITAL_COMMUNITY): Payer: Medicare Other

## 2010-06-10 DIAGNOSIS — J441 Chronic obstructive pulmonary disease with (acute) exacerbation: Secondary | ICD-10-CM

## 2010-06-10 DIAGNOSIS — J189 Pneumonia, unspecified organism: Secondary | ICD-10-CM

## 2010-06-10 DIAGNOSIS — I5031 Acute diastolic (congestive) heart failure: Secondary | ICD-10-CM

## 2010-06-10 LAB — COMPREHENSIVE METABOLIC PANEL
ALT: 54 U/L — ABNORMAL HIGH (ref 0–35)
AST: 34 U/L (ref 0–37)
Albumin: 3.4 g/dL — ABNORMAL LOW (ref 3.5–5.2)
Alkaline Phosphatase: 106 U/L (ref 39–117)
CO2: 35 mEq/L — ABNORMAL HIGH (ref 19–32)
Chloride: 96 mEq/L (ref 96–112)
Creatinine, Ser: 1.01 mg/dL (ref 0.4–1.2)
GFR calc Af Amer: >60 mL/min (ref >60)
GFR calc non Af Amer: 55 mL/min — ABNORMAL LOW (ref >60)
Potassium: 4.1 mEq/L (ref 3.5–5.1)
Sodium: 140 mEq/L (ref 135–145)
Total Bilirubin: 0.1 mg/dL — ABNORMAL LOW (ref 0.3–1.2)

## 2010-06-10 LAB — DIFFERENTIAL
Eosinophils Relative: 0 % (ref 0–5)
Lymphocytes Relative: 8 % — ABNORMAL LOW (ref 12–46)
Lymphs Abs: 1.3 10*3/uL (ref 0.7–4.0)
Monocytes Relative: 3 % (ref 3–12)
Neutro Abs: 14.7 10*3/uL — ABNORMAL HIGH (ref 1.7–7.7)

## 2010-06-10 LAB — CBC
HCT: 30.6 % — ABNORMAL LOW (ref 36.0–46.0)
Hemoglobin: 9.5 g/dL — ABNORMAL LOW (ref 12.0–15.0)
MCHC: 31 g/dL (ref 30.0–36.0)
RBC: 3.29 MIL/uL — ABNORMAL LOW (ref 3.87–5.11)

## 2010-06-10 LAB — PRO B NATRIURETIC PEPTIDE: Pro B Natriuretic peptide (BNP): 1664 pg/mL — ABNORMAL HIGH (ref 0–125)

## 2010-06-11 LAB — DIFFERENTIAL
Eosinophils Relative: 0 % (ref 0–5)
Lymphocytes Relative: 11 % — ABNORMAL LOW (ref 12–46)
Lymphs Abs: 1.9 10*3/uL (ref 0.7–4.0)
Monocytes Absolute: 1.3 10*3/uL — ABNORMAL HIGH (ref 0.1–1.0)

## 2010-06-11 LAB — CBC
HCT: 29.4 % — ABNORMAL LOW (ref 36.0–46.0)
Hemoglobin: 9.2 g/dL — ABNORMAL LOW (ref 12.0–15.0)
MCV: 91.3 fL (ref 78.0–100.0)
RDW: 14 % (ref 11.5–15.5)
WBC: 17.8 10*3/uL — ABNORMAL HIGH (ref 4.0–10.5)

## 2010-06-11 LAB — BASIC METABOLIC PANEL
BUN: 34 mg/dL — ABNORMAL HIGH (ref 6–23)
CO2: 39 mEq/L — ABNORMAL HIGH (ref 19–32)
GFR calc non Af Amer: 56 mL/min — ABNORMAL LOW (ref >60)
Glucose, Bld: 201 mg/dL — ABNORMAL HIGH (ref 70–99)
Potassium: 3.8 mEq/L (ref 3.5–5.1)

## 2010-06-11 LAB — IGG, IGA, IGM: IgM, Serum: 170 mg/dL (ref 60–263)

## 2010-06-13 NOTE — Assessment & Plan Note (Signed)
The pt needs to wear her oxygen on continuous mode whenever she leaves her house.  Can arrange for a better portable source when she returns for followup with me this week.

## 2010-06-13 NOTE — Assessment & Plan Note (Signed)
The pt has known severe disease, and now appears to be having an acute copd exacerbation.  I suspect this is either being triggered by a chest infection, or possibly acute or chronic sinusitis.  I have discussed with her aggressive OP treatment with abx and steroids vs inpt hospitalization.  The pt does not want to be admitted, and states "it costs me a thousand dollars every time I get admitted".  Will therefore try the OP route, but she is to f/u with me in a few days.  I would consider a trial of daliresp in this pt who is having fairly frequent exacerbations, even though this may be due to her chronic sinus disease.

## 2010-06-14 ENCOUNTER — Ambulatory Visit: Payer: Medicare Other | Admitting: Pulmonary Disease

## 2010-06-14 ENCOUNTER — Telehealth: Payer: Self-pay | Admitting: Pulmonary Disease

## 2010-06-14 NOTE — Telephone Encounter (Signed)
I am not this pt's primary md, nor do I prescribe either one of these for her.  I would suggest they call her primary md.

## 2010-06-14 NOTE — Telephone Encounter (Signed)
Spoke w/ pt daughter and states she has to bring pt to OV and states she is leaving for the beach 5/20. I advised her Dr. Shelle Iron did not have any openings and daughter states then she wanted to get pt in to see TP b/c she wants to take mother to the beach w/ her. Pt is coming in for hfu 5/11 at 9:00 am to see TP

## 2010-06-14 NOTE — Telephone Encounter (Signed)
Nurse calling to report drug interaction (level 1) between norvasc and simvastatin. Pls advise if pt should continue both of these medications or if change needs to be made.

## 2010-06-14 NOTE — Telephone Encounter (Signed)
Spoke w. Dois Davenport and advised her to call pt pcp. She states she will and nothing further was needed

## 2010-06-15 LAB — CULTURE, BLOOD (ROUTINE X 2)
Culture  Setup Time: 201205020212
Culture: NO GROWTH
Culture: NO GROWTH

## 2010-06-16 NOTE — H&P (Signed)
NAMELANEA, VANKIRK                   ACCOUNT NO.:  [id number]  MEDICAL RECORD NO.:  [id number]           PATIENT TYPE:  E  LOCATION:  [facility]LED                         FACILITY:  [facility] [place] [facility]  PHYSICIAN:  Isidor Holts, M.D.  DATE OF BIRTH:  16-Apr-1940  DATE OF ADMISSION:  06/08/2010 DATE OF DISCHARGE:                             HISTORY & PHYSICAL   PRIMARY PHYSICIAN:  Dr. Harrie Foreman, [place], [place].  PRIMARY PULMONOLOGIST:  Barbaraann Share, MD, FCCP  CHIEF COMPLAINT:  Progressive shortness of breath, cough productive of greenish phlegm, fever, and right-sided chest pain approximately 5 days.  HISTORY OF PRESENT ILLNESS:  This is a 70 year old female.  For past medical history see below.  The patient is an excellent historian. According to her, she became unwell on June 04, 2010, with cough productive of greenish phlegm, progressive shortness of breath above baseline, right-sided pleuritic-type chest pain, subjective fever and chills.  She called her PMD's office and had an appointment scheduled for June 07, 2010.  She was seen by the PMD on that date and given prescriptions.  The primary physician actually suggested hospitalization, but the patient declined at that time and since then she has felt progressively worse.  Today, her daughter called the primary physician's office and she was referred to the emergency department by EMS.  PAST MEDICAL HISTORY: 1. Severe stage 4 COPD (gold criteria). 2. Chronic respiratory failure, on home oxygen (3 L per minute). 3. Hypertension. 4. Depression. 5. Morbid obesity. 6. History of diastolic dysfunction, ejection fraction 65% to 70% per     2-D echocardiogram on March 05, 2010. 7. Chronic sinusitis. 8. Dyslipidemia. 9. Status post right lobectomy at age 46 years. 10.Right cataract. 11.Macular degeneration/legal blindness, left eye.  ALLERGIES:  MORPHINE, this causes hives.  MEDICATION HISTORY: 1. Albuterol nebulizer  2.5 mg/3 mL 1 treatment t.i.d. and p.r.n. q.4     hourly for shortness of breath. 2. Tylenol PM (500/25) 1-2 tablets p.o. q.h.s. p.r.n. for insomnia. 3. Spiriva HandiHaler (18 mcg) 1 capsule q.h.s. 4. Simvastatin 80 mg p.o. daily. 5. Robitussin DM (10/100) 5 mL p.o. daily at bedtime p.r.n. for cough. 6. Prevacid 50 mg p.o. daily. 7. Multivitamin therapeutic 1 p.o. daily. 8. Losartan 100 mg p.o. daily. 9. Lasix 20 mg p.o. alternate days. 10.Guaifenesin 400 mg p.o. p.r.n. q.4 hourly for congestion. 11.Flonase nasal spray 2 sprays each nostril daily. 12.Citalopram 20 mg p.o. daily. 13.Aspirin 81 mg p.o. daily. 14.Aleve 220 mg p.o. p.r.n. q.12 hourly for pain. 15.Advair Diskus (500/50) 1 puff b.i.d.  REVIEW OF SYSTEMS:  As per HPI and chief complaint.  The patient denies headache.  Denies sore throat or dysphagia.  Denies abdominal pain, vomiting, or diarrhea.  The rest of systems review is negative.  SOCIAL HISTORY:  The patient is divorced for several decades now, has 4 offspring, but only 3 are still living.  One daughter currently resides with her.  She is an ex-smoker, quit smoking approximately 2-3 years ago.  She started smoking from the age of 51.  Before she quit, was smoking about 3 packets of cigarettes per day.  Nondrinker.  Has no history of drug abuse.  FAMILY HISTORY:  Mother is deceased at age 61 years from lung cancer. She had also heart disease.  Father is deceased at age 33 years from colon cancer.  He had no other significant medical problems.  PHYSICAL EXAMINATION:  VITAL SIGNS:  Temperature 97.7, pulse 97 per minute and regular, respiratory rate 24, BP 181/65 mmHg, rechecked at 165/63 mmHg, pulse oximeter initially 66% on room air, subsequently 95% on 4 L of oxygen. GENERAL:  The patient did not appear to be in obvious acute distress at the time of this evaluation, alert, communicative, mildly short of breath at rest.  However, she is able to complete  sentences. HEENT:  Mild clinical pallor.  No jaundice.  No conjunctival injection. Hydration status appears fair. NECK:  Supple.  JVD not seen.  No palpable lymphadenopathy.  No palpable goiter. CHEST:  Old right lobectomy scar is noted.  There is bilateral expiratory wheeze particularly in the lung bases and in the apical regions on the left.  No crackles are heard.  There are bronchial breath sounds in the right mid zone. CARDIAC:  Heart sounds are heard, normal, regular, mildly tachycardic. No murmurs. ABDOMEN:  Moderately obese, soft, nontender.  Unable to palpate organs. LOWER EXTREMITIES:  No pitting edema.  Palpable peripheral pulses. Varicosities are noted. MUSCULOSKELETAL:  The patient has generalized osteoarthritic changes. CENTRAL NERVOUS SYSTEM:  No focal neurologic deficit on gross examination.  INVESTIGATIONS:  CBC, WBC 11.5, hemoglobin 9.1, hematocrit 28.8, platelets 316, MCV 91.4.  Electrolytes, sodium 140, potassium 5.1, chloride 99, CO2 of 33, BUN 23, creatinine 0.96, glucose 134.  Troponin I point-of-care less than 0.05.  ABGs done on 4 L of oxygen shows a pH of 7.356, pCO2 of 59.7, pO2 of 72.8, bicarbonate of 32.6.  Chest x-ray on Jun 08, 2010, shows increased right lung base opacity suspicious for pneumonia, pulmonary vascular congestion superimposed on chronic interstitial lung disease.  A 12-lead EKG on Jun 08, 2010, has a lot of baseline artifact, but shows sinus tachycardia with a ventricular rate of 109.  There was normal axis, otherwise no acute ischemic changes.  ASSESSMENT AND PLAN: 1. Right basal community-acquired pneumonia.  The patient has failed     attempted outpatient treatment with oral Levaquin.  Therefore, we     admit her.  Do blood cultures and start treatment with     Rocephin, azithromycin, Mucinex, bronchodilator nebulizers,     and oxygen supplementation.  2. Chronic obstructive pulmonary disease exacerbation.  This is     secondary to  #1 above.  We shall manage with parenteral steroids, as     well as above-mentioned management measures.  3. Acute-on-chronic hypercapnic/hypoxic respiratory failure.  Secondary     to right basal community-acquired pneumonia and chronic obstructive     pulmonary disease exacerbation. we shall manage as above.     Utilize p.r.n. BiPAP if indicated.  4. Uncontrolled hypertension.  We shall manage with preadmission     antihypertensive medications, but observe and modify treatment as     indicated.  5. Dyslipidemia.  The patient continues on statin treatment.  6. History of diastolic dysfunction.  Clinically, the patient does not     appear to be in congestive heart failure at this time.  We shall     follow BNP.    Further management will depend on clinical course.  Note Dr. Marcelyn Bruins, the patient's primary pulmonologist, will be informed of admission on  Jun 09, 2010.     Isidor Holts, M.D.     CO/MEDQ  D:  06/08/2010  T:  06/08/2010  Job:  [id number]  cc:   Dr. Marisue Ivan [place], [facility]  Barbaraann Share, MD,FCCP [address] [place] [address]  Electronically Signed by Isidor Holts M.D. on 06/16/2010 06:57:30 PM

## 2010-06-18 ENCOUNTER — Inpatient Hospital Stay: Payer: Medicare Other | Admitting: Adult Health

## 2010-06-20 NOTE — Discharge Summary (Signed)
NAMEJOSEFINA, [name]                   ACCOUNT NO.:  [id number]  MEDICAL RECORD NO.:  [id number]           PATIENT TYPE:  I  LOCATION:  1414                         FACILITY:  [facility]  PHYSICIAN:  Rock Nephew, MD       DATE OF BIRTH:  08/24/1940  DATE OF ADMISSION:  06/08/2010 DATE OF DISCHARGE:  06/11/2010                        DISCHARGE SUMMARY - REFERRING   PRIMARY CARE PHYSICIAN:  Dr. Marisue Ivan in [place], [place].  DISCHARGE DIAGNOSES:  The patient's discharge diagnosis are as follows: 1. Right community-acquired pneumonia. 2. Acute chronic obstructive pulmonary disease. 3. Acute-on-chronic respiratory failure, on 3 L chronic oxygen,     discharged on 4 L. 4. Uncontrolled hypertension. 5. Dyslipidemia. 6. Normocytic anemia. 7. Increased BNP, possible acute diastolic CHF. 8. Hyperkalemia, resolved.  Angiotensin receptive blocker     discontinued. 9. Debility, needing home health PT, OT and 3-in-1.  DISCHARGE MEDICATIONS:  Discharge medications for the patient are as follows: 1. Avelox 400 mg p.o. daily. 2. Amlodipine 10 mg p.o. daily. 3. Prednisone 60 mg for 2 days, 50 mg for 2 days, 40 mg for 2 days, 30     mg for 2 days, 20 mg for 2 days, 10 mg for 2 days. 4. Daliresp 500 mcg p.o. daily. 5. Advair Diskus 500/50 one puff inhaled twice daily. 6. Albuterol 2.5 mg one nebulization by mouth three times a day. 7. Aleve 220 mg 1 tablet p.o. every 12 hours as needed. 8. Aspirin 81 mg p.o. daily. 9. Citalopram 40 mg 1/2 tablet by mouth daily. 10.Flonase 2 sprays nasally daily. 11.Guaifenesin 400 mg 1 tablet by mouth every 4 hours as needed. 12.Lasix 20 mg p.o. every other day. 13.Multivitamins 1 tablet p.o. daily. 14.Prevacid 15 mg 1 tablet p.o. daily. 15.Robitussin 5 mL by mouth daily at bedtime as needed. 16.Simvastatin 80 mg p.o. daily. 17.Spiriva 18 mcg 1 capsule inhaled every evening. 18.Tylenol PM 500/25 one to two tablets by mouth daily at bedtime  as     needed. 19.Ventolin 2.5 mg one nebulization inhaled every 4 hours as needed.  DISPOSITION:  The patient is discharged home on 4 L of oxygen continuous.  The patient's diet should be heart-healthy with 1.5 L fluid restriction.  FOLLOWUP:  The patient should follow up with Dr. Marisue Ivan in 1 week.  The patient should follow up with Dr. Marcelyn Bruins in 2 weeks. The patient should also be set up with home health PT, OT and 3-in-1 at home.  PROCEDURES PERFORMED:  The patient had a chest x-ray on Jun 08, 2010, which showed: 1. Increase in parenchymal opacity medially in the right lung base     suspicious for pneumonia. 2. Question of pulmonary vascular congestion superimposed upon chronic     interstitial lung disease.  The patient's chest x-ray on Jun 10, 2010, showed: 1. Enlargement of cardiac silhouette which appears slightly larger     than on previous study. 2. Mild vascular congestion pattern. 3. Basilar atelectasis, increased markings with slight haziness and     blunting of the right costophrenic angle with stable pleural  opacity. 4. No new infiltrative density is evident. 5. Lung changes appears stable.  CT maxillofacial studies showed negative for acute-on-chronic sinusitis.  CONSULTATIONS:  Consultations on this case is Dr. Marcelyn Bruins.  BRIEF HISTORY OF PRESENT ILLNESS:  CHIEF COMPLAINT:  Progressive shortness of breath, cough productive greenish phlegm, fever, right-sided chest pain approximately 5 days ago.  This is a 70 year old female with a past medical history of severe COPD on oxygen dependent 3 L at home.  The patient has been coughing up greenish phlegm and shortness of breath at baseline which has gotten worse.  The patient came to the ED for evaluation.  Chest x-ray was suspicious for pneumonia.  HOSPITAL COURSE: 1. Right-sided community-acquired pneumonia.  The patient was placed     on ceftriaxone and Avelox.  The patient is  allergic to     azithromycin.  The patient treated with steroids, nebulizations.     The patient's blood cultures were negative.  The patient was seen     in consultation with Dr. Marcelyn Bruins.  He recommended on discharge     the patient to get 4 more days of antibiotics.  The patient will be     discharged on 4 more days of antibiotics. 2. Acute COPD.  The patient had acute COPD.  She is on 3 L oxygen home     dependent.  The patient has been on 4 L during the admission.  The     patient has felt better on Jun 11, 2010.  She is deemed ready for     discharge.  The patient is also started on a medicine called     Daliresp by Dr. Marcelyn Bruins.  The patient received Solu-Medrol and     nebulizations during the hospitalization.  The patient will be     discharged home on a prednisone taper. 3. Acute-on-chronic respiratory failure.  The patient had acute-on-     chronic respiratory failure.  The patient had severe COPD, 3 L at     baseline.  Currently, the patient will be on 4 L oxygen. 4. Uncontrolled hypertension.  The patient's uncontrolled hypertension     has resolved.  The patient's angiotensin receptor blocker was     discontinued secondary to hyperkalemia and the patient was started     on amlodipine. 5. Dyslipidemia.  The patient has a history of dyslipidemia and the     patient takes simvastatin at home. 6. Normocytic anemia.  The patient has normocytic anemia which is     stable. 7. Increased BNP.  The patient has had an elevated pro BNP.  The     patient did receive 40 mg of IV Lasix dose.  The patient will go     home with dose of Lasix 20 mg a day which she was taking at home. 8. Hyperkalemia.  The patient did develop some hyperkalemia.  As a     result, the angiotensin receptor blocker that the patient was     taking at home was discontinued and amlodipine was started. 9. Debility.  The patient has a history of debility most likely     related to her chronic respiratory  failure.  The     patient was seen by PT, OT.  They recommended home health, PT, OT;     will also get the patient 3-in-1.  She lives at home with her     daughter. 10.For DVT prophylaxis, the patient received Lovenox.  Rock Nephew, MD     NH/MEDQ  D:  06/11/2010  T:  06/11/2010  Job:  [id number]  cc:   Barbaraann Share, MD,FCCP [address] [place] [address]  Dr. Marisue Ivan  Electronically Signed by Rock Nephew MD on 06/20/2010 01:57:49 PM

## 2010-06-22 ENCOUNTER — Ambulatory Visit (INDEPENDENT_AMBULATORY_CARE_PROVIDER_SITE_OTHER): Payer: Self-pay | Admitting: Adult Health

## 2010-06-22 ENCOUNTER — Ambulatory Visit (INDEPENDENT_AMBULATORY_CARE_PROVIDER_SITE_OTHER)
Admission: RE | Admit: 2010-06-22 | Discharge: 2010-06-22 | Disposition: A | Discharge status: Home / Self Care | Payer: Self-pay | Source: Ambulatory Visit | Attending: Adult Health | Admitting: Adult Health

## 2010-06-22 ENCOUNTER — Encounter: Payer: Self-pay | Admitting: Adult Health

## 2010-06-22 VITALS — BP 108/58 | HR 86 | Temp 97.9°F | Ht 64.0 in | Wt 203.0 lb

## 2010-06-22 DIAGNOSIS — J438 Other emphysema: Secondary | ICD-10-CM

## 2010-06-22 DIAGNOSIS — J189 Pneumonia, unspecified organism: Secondary | ICD-10-CM

## 2010-06-22 NOTE — Assessment & Plan Note (Addendum)
RLL CAP -improving with abx.  Plan  Cont on current regimen.  follow up CXR

## 2010-06-22 NOTE — Patient Instructions (Signed)
Finish Prednisone taper as directed.  Begin Daliresp daily  follow up Dr. Shelle Iron in 4 weeks and As needed   I will call with xray results.

## 2010-06-22 NOTE — Progress Notes (Signed)
  Subjective:    Patient ID: [name], female    DOB: [birth year], 70 y.o.   MRN: [id number]  HPI 44 you WF with known hx of severe emphysema that is oxygen dependent  06/22/10 Post Hospital follow up  Pt was 5/1-06/11/10 for COPD exacerbation secondary to RLL CAP, suspected diastolic dysfnx. Tx w/ IV abx and steroids.  Along aggressive pulmonary hygiene and diuresis. She responded nicely. Discharged on Avelox and steroid taper.  She has 1 day left of meds. SHE was also changed from losartan to norvasc.  CT sinus was neg for acute process.  Since discharge she is feeling much better. Weight is down 9 lbs. Breathing is back to baseline.  Feels better than along time. Has been doing light exercise and walking at home. NO increased swelling.  Was started on Daliresp but insurance is requiring a PA. We gave her samples today.     Review of Systems Constitutional:   No  weight loss, night sweats,  Fevers, chills  HEENT:   No headaches,  Difficulty swallowing,  Tooth/dental problems, or  Sore throat,                No sneezing, itching, ear ache, nasal congestion, post nasal drip,   CV:  No chest pain,  Orthopnea, PND, swelling in lower extremities, anasarca, dizziness, palpitations, syncope.   GI  No heartburn, indigestion, abdominal pain, nausea, vomiting, diarrhea, change in bowel habits, loss of appetite, bloody stools.   Resp:   No excess mucus, no productive cough,  No non-productive cough,  No coughing up of blood.  No change in color of mucus.  No wheezing.  No chest wall deformity  Skin: no rash or lesions.  GU: no dysuria, change in color of urine, no urgency or frequency.  No flank pain, no hematuria   MS:  No joint pain or swelling.  No decreased range of motion.  No back pain.  Psych:  No change in mood or affect. No depression or anxiety.  No memory loss.         Objective:   Physical Exam GEN: A/Ox3; pleasant , NAD, elderly On O2.   HEENT:  [place]/AT,  EACs-clear,  TMs-wnl, NOSE-clear, THROAT-clear, no lesions, no postnasal drip or exudate noted.   NECK:  Supple w/ fair ROM; no JVD; normal carotid impulses w/o bruits; no thyromegaly or nodules palpated; no lymphadenopathy.  RESP  Coarse BS w/no wheezing noted. Diminished in bases. no accessory muscle use, no dullness to percussion  CARD:  RRR, no m/r/g  , no peripheral edema, pulses intact, no cyanosis or clubbing.  GI:   Soft & nt; nml bowel sounds; no organomegaly or masses detected.  Musco: Warm bil, no deformities or joint swelling noted.   Neuro: alert, no focal deficits noted.    Skin: Warm, no lesions or rashes         Assessment & Plan:

## 2010-06-22 NOTE — Assessment & Plan Note (Signed)
Recent COPD flare with assoicated PNA and diastolic overload.  Plan:  Finish steroids as planned  Begin daliresp. 1 month supply given .  follow up Dr. Shelle Iron in 4 weeks  Please contact office for sooner follow up if symptoms do not improve or worsen or seek emergency care

## 2010-06-24 ENCOUNTER — Telehealth: Payer: Self-pay | Admitting: *Deleted

## 2010-06-24 NOTE — Telephone Encounter (Signed)
Form received and placed on Megan's desk.

## 2010-06-24 NOTE — Telephone Encounter (Signed)
PA initiated for Daliresp. Member ID # [id number]. Awaiting fax.

## 2010-06-28 NOTE — Telephone Encounter (Signed)
Put PA in KC's very important look at folder.

## 2010-06-30 NOTE — Telephone Encounter (Signed)
Received fax back from [employer].  Daliresp approved through 06/30/2011.

## 2010-06-30 NOTE — Telephone Encounter (Signed)
PA form completed and faxed back to [employer] at [phone]. Form placed in triage awaiting response from pt's insurance.

## 2010-08-04 ENCOUNTER — Ambulatory Visit: Payer: Medicare Other | Admitting: Pulmonary Disease

## 2010-08-10 ENCOUNTER — Other Ambulatory Visit: Payer: Self-pay | Admitting: Pulmonary Disease

## 2010-08-24 ENCOUNTER — Ambulatory Visit: Payer: Self-pay | Admitting: Pulmonary Disease

## 2010-10-04 ENCOUNTER — Telehealth: Payer: Self-pay | Admitting: Pulmonary Disease

## 2010-10-04 NOTE — Telephone Encounter (Signed)
lmomtcb for teresa

## 2010-10-04 NOTE — Telephone Encounter (Signed)
PT'S DAUGHTER TERESA CALLED BACK. SHE TOOK PT TO ED. O2 LEVELS "GOOD NOW" BUT TOLD TO F/U WITH PULM AS THERE IS "SOMETHING THEY HEAR IN LUNGS". PT SCHEDULED TO SEE TP THURS AS NO AVAIL ON [facility]'S SCHEDULE BEFORE THEN. I OFFERED TOMORROW IN [place] WITH TP BUT DAUGHTER REFUSES TO GO THERE. WAS OK TO WAIT FOR THURS APPT UNLESS NURSE ADVISES OTHERWISE. Hazel Sams

## 2010-10-04 NOTE — Telephone Encounter (Signed)
Advised to got to ED if necessary.  [name]

## 2010-10-04 NOTE — Telephone Encounter (Signed)
Will sign off since pt is coming in to see tp on thursday

## 2010-10-07 ENCOUNTER — Ambulatory Visit: Payer: Self-pay | Admitting: Adult Health

## 2010-11-01 ENCOUNTER — Ambulatory Visit: Payer: Self-pay | Admitting: Pulmonary Disease

## 2010-11-01 ENCOUNTER — Emergency Department: Payer: Self-pay | Admitting: Emergency Medicine

## 2010-11-17 ENCOUNTER — Other Ambulatory Visit: Payer: Self-pay | Admitting: Pulmonary Disease

## 2010-11-17 NOTE — Telephone Encounter (Signed)
Denied rx per [facility] d/t pt continuing to cancel or no show to appts. Pt hasn't followed up with [facility] compliantly.

## 2011-01-03 ENCOUNTER — Inpatient Hospital Stay: Payer: Self-pay | Admitting: Internal Medicine

## 2011-02-25 ENCOUNTER — Telehealth: Payer: Self-pay | Admitting: Pulmonary Disease

## 2011-02-25 NOTE — Telephone Encounter (Signed)
I called back and they advised they will refax form and I gave them correct fax [phone]. Will forward to Mindy S to locate and address same.

## 2011-03-01 NOTE — Telephone Encounter (Signed)
I spoke with [employer] and was advised they are going to re fax the form over. Also this is not a [facility] pt it is a [facility] pt. Will forward to [facility] to look out for fax coming over on pt or if she has already received a fax on this pt. Please advise Aundra Millet thanks

## 2011-03-02 NOTE — Telephone Encounter (Signed)
Called and spoke with Gunnar Fusi from [employer] who stated she already had everything she needed on pt and nothing further was needed from our office.  Will close message.

## 2011-03-22 ENCOUNTER — Inpatient Hospital Stay: Payer: Self-pay | Admitting: Specialist

## 2011-03-22 LAB — COMPREHENSIVE METABOLIC PANEL
Anion Gap: 5 — ABNORMAL LOW (ref 7–16)
BUN: 13 mg/dL (ref 7–18)
Bilirubin,Total: 0.2 mg/dL (ref 0.2–1.0)
Chloride: 96 mmol/L — ABNORMAL LOW (ref 98–107)
Creatinine: 0.84 mg/dL (ref 0.60–1.30)
EGFR (African American): >60
Osmolality: 281 (ref 275–301)
Potassium: 3.5 mmol/L (ref 3.5–5.1)
SGOT(AST): 23 U/L (ref 15–37)
Sodium: 140 mmol/L (ref 136–145)
Total Protein: 7.5 g/dL (ref 6.4–8.2)

## 2011-03-22 LAB — CBC
HGB: 10.1 g/dL — ABNORMAL LOW (ref 12.0–16.0)
MCV: 86 fL (ref 80–100)
Platelet: 265 10*3/uL (ref 150–440)
RBC: 3.6 10*6/uL — ABNORMAL LOW (ref 3.80–5.20)

## 2011-03-22 LAB — TROPONIN I: Troponin-I: <0.02 ng/mL

## 2011-03-22 LAB — CK TOTAL AND CKMB (NOT AT ARMC)
CK, Total: 28 U/L (ref 21–215)
CK-MB: <0.5 ng/mL — ABNORMAL LOW (ref 0.5–3.6)

## 2011-03-23 LAB — BASIC METABOLIC PANEL
BUN: 16 mg/dL (ref 7–18)
EGFR (Non-African Amer.): >60
Glucose: 124 mg/dL — ABNORMAL HIGH (ref 65–99)
Potassium: 4.3 mmol/L (ref 3.5–5.1)
Sodium: 140 mmol/L (ref 136–145)

## 2011-03-23 LAB — CBC WITH DIFFERENTIAL/PLATELET
Basophil #: 0 10*3/uL (ref 0.0–0.1)
Eosinophil #: 0 10*3/uL (ref 0.0–0.7)
Lymphocyte %: 9.3 %
MCHC: 33 g/dL (ref 32.0–36.0)
Neutrophil #: 5.1 10*3/uL (ref 1.4–6.5)
Neutrophil %: 88.6 %
Platelet: 249 10*3/uL (ref 150–440)
RDW: 15.6 % — ABNORMAL HIGH (ref 11.5–14.5)

## 2011-03-24 LAB — BASIC METABOLIC PANEL
Calcium, Total: 8.8 mg/dL (ref 8.5–10.1)
Chloride: 97 mmol/L — ABNORMAL LOW (ref 98–107)
Co2: 38 mmol/L — ABNORMAL HIGH (ref 21–32)
Osmolality: 285 (ref 275–301)
Potassium: 4 mmol/L (ref 3.5–5.1)
Sodium: 140 mmol/L (ref 136–145)

## 2011-03-28 LAB — CULTURE, BLOOD (SINGLE)

## 2012-04-27 IMAGING — CR DG CHEST 1V PORT
1 series · 1 of 1 positions shown · non-contrast
Comparison: none

REASON FOR EXAM: fever
COMMENTS:

PROCEDURE:     DXR - DXR PORTABLE CHEST SINGLE VIEW  - January 03, 2011  [DATE]
RESULT:     Comparison: 11/01/2010

[portable]
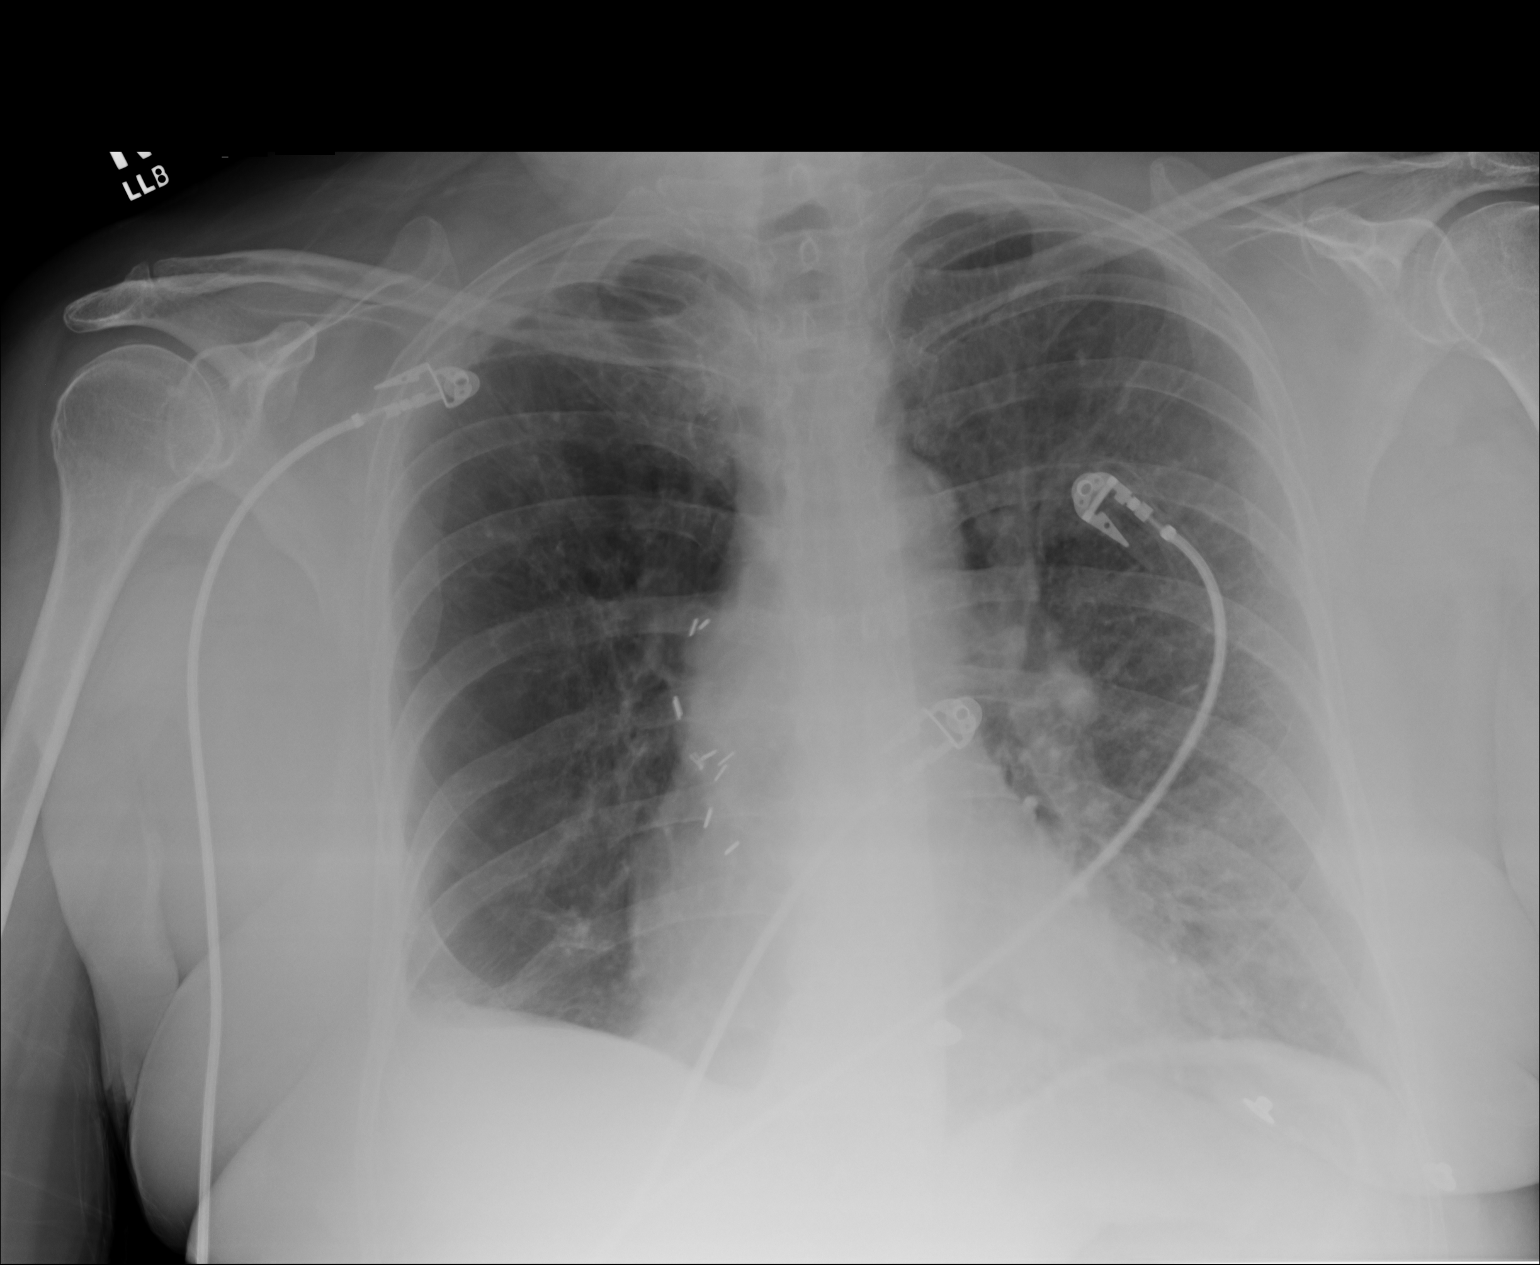

[1 of 1 positions shown; findings below may reference images not displayed]

FINDINGS: Single portable AP chest radiograph is provided. There is left lower lobe
airspace disease most concerning for pneumonia. There is no other focal
parenchymal opacity, pleural effusion, or pneumothorax. Normal
cardiomediastinal silhouette. The osseous structures are unremarkable.
IMPRESSION: Left lower lobe pneumonia. Recommend followup radiography to document
complete resolution following adequate medical therapy. If there is not
complete resolution, then recommend further evaluation with CT of the chest
to exclude underlying pathology.

## 2012-04-28 IMAGING — CR DG CHEST 1V PORT
1 series · 1 of 1 positions shown · non-contrast
Comparison: none

REASON FOR EXAM: fever, shortness of breath
COMMENTS:

PROCEDURE:     DXR - DXR PORTABLE CHEST SINGLE VIEW  - January 04, 2011  [DATE]
RESULT:     Comparison: 01/03/2011

[portable]
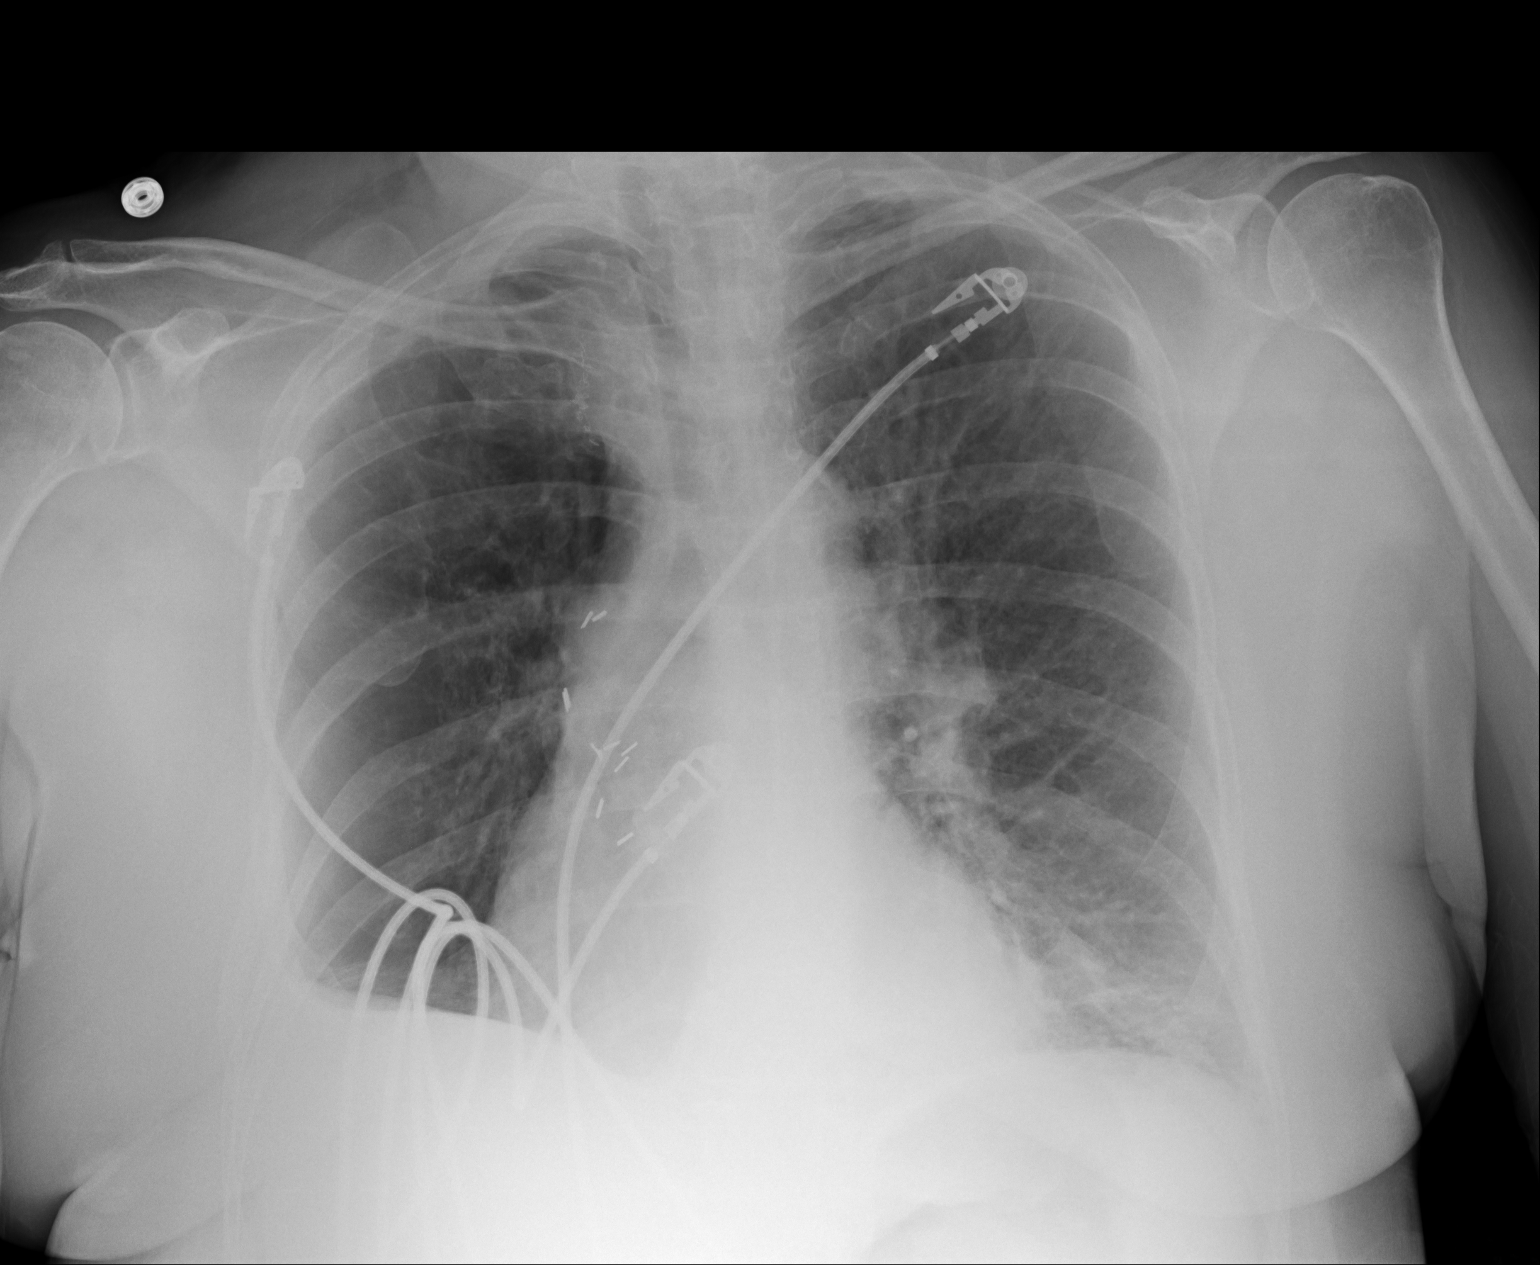

[1 of 1 positions shown; findings below may reference images not displayed]

FINDINGS: The patient is rotated to the right. Heart and mediastinum are stable, given
patient rotation. Mild heterogeneous opacities in the lower left lung are
essentially unchanged from the recent prior radiograph. Blunting of the
right costophrenic angle is unchanged.
IMPRESSION: Unchanged heterogeneous opacities in the left lower lung, concerning for
infection. Continued followup is recommended to ensure resolution.

## 2012-06-26 ENCOUNTER — Other Ambulatory Visit: Payer: Self-pay | Admitting: *Deleted

## 2012-06-26 MED ORDER — ZOLPIDEM TARTRATE 5 MG PO TABS: ORAL_TABLET | ORAL | Status: AC

## 2012-07-11 ENCOUNTER — Other Ambulatory Visit: Payer: Self-pay | Admitting: *Deleted

## 2012-07-11 MED ORDER — HYDROMORPHONE HCL 1 MG/ML PO LIQD: ORAL | Status: AC

## 2012-08-23 ENCOUNTER — Encounter (INDEPENDENT_AMBULATORY_CARE_PROVIDER_SITE_OTHER): Payer: Medicare Other | Admitting: Ophthalmology

## 2012-08-23 DIAGNOSIS — H35039 Hypertensive retinopathy, unspecified eye: Secondary | ICD-10-CM

## 2012-08-23 DIAGNOSIS — I10 Essential (primary) hypertension: Secondary | ICD-10-CM

## 2012-08-23 DIAGNOSIS — H3530 Unspecified macular degeneration: Secondary | ICD-10-CM

## 2012-08-23 DIAGNOSIS — H2510 Age-related nuclear cataract, unspecified eye: Secondary | ICD-10-CM

## 2012-08-23 DIAGNOSIS — H43819 Vitreous degeneration, unspecified eye: Secondary | ICD-10-CM

## 2012-09-03 ENCOUNTER — Non-Acute Institutional Stay (SKILLED_NURSING_FACILITY): Payer: Medicare Other | Admitting: Internal Medicine

## 2012-09-03 DIAGNOSIS — F411 Generalized anxiety disorder: Secondary | ICD-10-CM

## 2012-09-03 DIAGNOSIS — G894 Chronic pain syndrome: Secondary | ICD-10-CM

## 2012-09-03 DIAGNOSIS — J441 Chronic obstructive pulmonary disease with (acute) exacerbation: Secondary | ICD-10-CM

## 2012-09-03 DIAGNOSIS — F329 Major depressive disorder, single episode, unspecified: Secondary | ICD-10-CM

## 2012-09-03 DIAGNOSIS — D509 Iron deficiency anemia, unspecified: Secondary | ICD-10-CM

## 2012-09-03 DIAGNOSIS — F32A Depression, unspecified: Secondary | ICD-10-CM

## 2012-09-03 DIAGNOSIS — I10 Essential (primary) hypertension: Secondary | ICD-10-CM

## 2012-09-03 NOTE — Progress Notes (Signed)
Patient ID: Isabella Stalling, female   DOB: 04-20-1940, 72 y.o.   MRN: [id number]  [facility]   Allergies  Allergen Reactions  . Azithromycin     unknown  . Morphine     Chief Complaint  Patient presents with  . Cough  . Nasal Congestion  . Medical Managment of Chronic Issues    HPI:  72 y/o female is a long term resident here. She was seen today for routine visit. She has been having cough with nasal stuffiness for 2 weeks. She has increased cough with secretions and dark colored mucus. No fever or chills reported. She complaints of shortness of breath with her chest and sinuses feeling stuffed up  Review of Systems:  Denies fever or chills Denies chest pain Denies abdominal pain, nausea or vomiting Denies diarrhea or constipation Denies any recent falls/ trauma Feels weak and tired Does not have appetite Denies ear aches or drainage  Past Medical History  Diagnosis Date  . Emphysema   . Allergic rhinitis   . Hypertension   . Hyperlipidemia   . GERD (gastroesophageal reflux disease)    Past Surgical History  Procedure Laterality Date  . Lung removal, partial  1973    R lung  . Cervical fusion  1996  . Abdominal hysterectomy  1974  . Tubal ligation  1971  . Appendectomy    . Cleft palate repair  1948   Social History:   reports that she quit smoking about 4 years ago. She does not have any smokeless tobacco history on file. Her alcohol and drug histories are not on file.  Family History  Problem Relation Age of Onset  . Allergies Daughter   . Asthma Daughter   . Heart disease Mother   . Heart disease Brother   . Colon cancer Father     Medications: Patient's Medications  New Prescriptions   No medications on file  Previous Medications   ALPRAZOLAM (XANAX) 0.5 MG TABLET    Take 0.5 mg by mouth 3 (three) times daily as needed for sleep.   ASPIRIN 81 MG TABLET    Take 81 mg by mouth daily.     CARVEDILOL (COREG) 6.25 MG TABLET    Take 6.25 mg by  mouth 2 (two) times daily with a meal.   CITALOPRAM (CELEXA) 40 MG TABLET    Take 20 mg by mouth daily.     FERROUS GLUCONATE (FERGON) 325 MG TABLET    Take 325 mg by mouth 2 (two) times daily.   FLUTICASONE (FLONASE) 50 MCG/ACT NASAL SPRAY    Place 2 sprays into the nose daily.   FLUTICASONE-SALMETEROL (ADVAIR DISKUS) 500-50 MCG/DOSE AEPB    Inhale 1 puff into the lungs every 12 (twelve) hours.     FUROSEMIDE (LASIX) 20 MG TABLET    Take 60 mg by mouth daily.    GABAPENTIN (NEURONTIN) 100 MG CAPSULE    Take 100 mg by mouth 3 (three) times daily.   GUAIFENESIN (HUMIBID E) 400 MG TABS    Take 400 mg by mouth 3 (three) times daily.    HYDROMORPHONE HCL (DILAUDID) 1 MG/ML LIQD    Give 0.43ml by mouth every 4 hours as needed for pain/shortness of breath   IPRATROPIUM-ALBUTEROL (DUONEB) 0.5-2.5 (3) MG/3ML SOLN    Take 3 mLs by nebulization every 6 (six) hours.   LANSOPRAZOLE (PREVACID) 30 MG CAPSULE    Take 30 mg by mouth 2 (two) times daily.     POLYETHYLENE  GLYCOL (MIRALAX / GLYCOLAX) PACKET    Take 17 g by mouth daily.   POTASSIUM CHLORIDE SA (K-DUR,KLOR-CON) 20 MEQ TABLET    Take 20 mEq by mouth daily.   PREDNISONE (DELTASONE) 5 MG TABLET    Take 5 mg by mouth daily.   ROFLUMILAST (DALIRESP) 500 MCG TABS TABLET    Take 500 mcg by mouth daily.   SPIRIVA HANDIHALER 18 MCG INHALATION CAPSULE    INHALE 1 CAPSULE VIA HANDIHALER ONCE DAILY AT THE SAME TIME EVERY DAY   ZOLPIDEM (AMBIEN) 5 MG TABLET    Take 1 tablet every night at bedtime for sleep  Modified Medications   No medications on file  Discontinued Medications   ALBUTEROL (PROAIR HFA) 108 (90 BASE) MCG/ACT INHALER    Inhale 2 puffs into the lungs every 6 (six) hours as needed.     ALBUTEROL (PROVENTIL) (2.5 MG/3ML) 0.083% NEBULIZER SOLUTION    Take 2.5 mg by nebulization 2 (two) times daily.     AMLODIPINE (NORVASC) 10 MG TABLET    Take 1 tablet by mouth daily.   CHLORPHENIRAMINE-HYDROCODONE (TUSSIONEX PENNKINETIC ER) 10-8 MG/5ML LQCR    Take  5 mLs by mouth every 12 (twelve) hours as needed.     DIPHENHYDRAMINE-ACETAMINOPHEN (TYLENOL PM) 25-500 MG TABS    Take 2 tabs by mouth at bedtime    FLUTICASONE (FLONASE) 50 MCG/ACT NASAL SPRAY    2 sprays by Nasal route daily.     MULTIPLE VITAMINS-MINERALS (CENTRUM SILVER PO)    Take 1 tablet by mouth daily.     NAPROXEN SODIUM (ALEVE) 220 MG CAPS    Take 1 capsule by mouth as needed.     PREDNISONE (DELTASONE) 10 MG TABLET    Take 4 daily x 2 days, then 3 daily x 2 days, then 2 daily x 2 days, then 1 daily x 2 days, then stop.   PSEUDOEPHEDRINE-ACETAMINOPHEN ([employer] SINUS HEADACHE DECONGEST) 30-500 MG TABS    2 to 3 times daily    SIMVASTATIN (ZOCOR) 80 MG TABLET    Take 80 mg by mouth at bedtime.       Physical Exam: bp 128/64, hr 92/min, rr 20/min, o2 sat 94%  gen- elderly female patient in mild distress HEENT- no pallor, no icterus, no LAD, MMM CVS- n s1,s2, rrr respi- b/l diffuse rhonchi, no crackles, has expiratory wheeze present, no accessory muscle use abdo- bs+, soft, non tender Ext- sitting on wheelchair Neuro- aaox 3, no focal deficit Psych- calm, mood appropriate  Assessment/Plan  Copd exacerbation- Dyspnea, increased cough and increased sputum production with lung exam finding s/o copd exacerbation Continue o2 by nasal canula and humibid Continue spiriva, daliresp and duoneb with advair Will change prednisone to 40 mg po daily for 5 days and then down to 5 mg daily Add moxifloxacin 400 mg po daily for 5 days Continue to monitor breathing status Continue flonase If no improvement will get labs and cxr for further evaluation  Anxiety- continue xanax for now  HTN- continue coreg, lasix with aspirin  Depression- will continue celexa for now, mood is stable. Continue zolpidem as well  Iron def anemia- continue iron supplement, monitor cbc  gerd- stable, will continue prevacid  Chronic pain- will continue her dilaudid and gabapentin for now

## 2012-09-14 DIAGNOSIS — J441 Chronic obstructive pulmonary disease with (acute) exacerbation: Secondary | ICD-10-CM | POA: Insufficient documentation

## 2012-09-14 DIAGNOSIS — G894 Chronic pain syndrome: Secondary | ICD-10-CM | POA: Insufficient documentation

## 2012-09-14 DIAGNOSIS — D509 Iron deficiency anemia, unspecified: Secondary | ICD-10-CM | POA: Insufficient documentation

## 2012-10-03 ENCOUNTER — Non-Acute Institutional Stay (SKILLED_NURSING_FACILITY): Payer: Medicare Other | Admitting: Nurse Practitioner

## 2012-10-03 DIAGNOSIS — G894 Chronic pain syndrome: Secondary | ICD-10-CM

## 2012-10-03 DIAGNOSIS — D492 Neoplasm of unspecified behavior of bone, soft tissue, and skin: Secondary | ICD-10-CM

## 2012-10-03 DIAGNOSIS — R0602 Shortness of breath: Secondary | ICD-10-CM

## 2012-10-16 ENCOUNTER — Other Ambulatory Visit: Payer: Self-pay | Admitting: *Deleted

## 2012-10-16 MED ORDER — ALPRAZOLAM 0.5 MG PO TABS: ORAL_TABLET | ORAL | Status: AC

## 2012-10-19 ENCOUNTER — Non-Acute Institutional Stay (SKILLED_NURSING_FACILITY): Payer: Medicare Other | Admitting: Nurse Practitioner

## 2012-10-19 DIAGNOSIS — R0602 Shortness of breath: Secondary | ICD-10-CM

## 2012-10-19 DIAGNOSIS — G894 Chronic pain syndrome: Secondary | ICD-10-CM

## 2012-10-19 DIAGNOSIS — F329 Major depressive disorder, single episode, unspecified: Secondary | ICD-10-CM

## 2012-10-19 DIAGNOSIS — D509 Iron deficiency anemia, unspecified: Secondary | ICD-10-CM

## 2012-10-19 DIAGNOSIS — D492 Neoplasm of unspecified behavior of bone, soft tissue, and skin: Secondary | ICD-10-CM

## 2012-11-12 ENCOUNTER — Encounter: Payer: Self-pay | Admitting: Nurse Practitioner

## 2012-11-12 NOTE — Progress Notes (Signed)
10/03/2012  Patient name:  [name]  DOB:  [birth year]  Medical ID:  [id number] CODE STATUS: DO NOT RESUSCITATE Facility:  [facility], room number 204B  Allergies: Morphine Codeine Erythromycin  Chief complaint: Medical management of patient's chronic illnesses.  Patient also reports flaking skin areas are across her face below her eyes. Also a "growth" on her right lateral forearm.  Diagnoses: COPD, bronchiectasis Congestive heart failure Hypertension Diverticulosis Skin malignancy Sleep apnea Insomnia Depression  History of present illness: Patient is being seen today for a routine visit. She is a patient of hospice with meds [place] for end-stage COPD. She uses BiPAP at that time and is on 4 L of oxygen continuous. Patient reports significant loss of lung function to bronchiectasis along with damage caused by long-term cigarette use subsequently leading to severe COPD. Report shortness of breath with activity, however she is able to walk with episodic periods of rest. Reports that I wanted is helpful when she had severe shortness of breath.    Medications: Daliresp , one tab each day Spiriva 18 mcg, contents of one capsule inhaled each day Deltasone 5 mg one tablet each day Aspirin 81 mg one tablet each day Celexa 20 mg each day K-Dur 20 mEq each day MiraLax 17 g in 8 ounces of liquid each day Flonase 50 mcg, 2 puffs each nares once a day Lasix 20 mg, 3 tablets each day Ocuvite 1 tablet each day Advair Diskus 500/50 one puff inhaled twice a day Prevacid 30 mg one capsule each day Ferrous sulfate 325 mg one tablet twice a day Coreg 6.25 mg one tablet twice a day Neurontin 100 mg one capsule 3 times a day DuoNeb one treatment of 3 mL via nebulizer 4 times a day Mucus relief tab 400 mg one tablet 3 times a day Xanax 0.5 mg one tablet 3 times a day for anxiety Ambien 5 mg one tablet each night Oxygen at 4 L continuous Systane eyedrops 2 drops in each eye as  needed Hydromorphone liquid, 1 mg per mL, 0.5 mL once every 4 hours as needed for shortness of breath Tylenol extra strength, 500 mg, 2 tablets at bedtime Senokot S2 tablets at bedtime  Social history: Patient started smoking at the age of 72, in 61, and stopped smoking approximately 2008. She reports consumption of 3 packs of cigarettes per day. Approximately, 165-pack-year history. No history of alcohol or recreational substance use.  Review of systems: Patient reports that she will Skin:  Verbalizes concern regarding patchy areas across her face, just below her eyes. Also regarding a "horny" appearing growth on her right lateral forearm. Ears:  No complaints of ear pain Mouth/throat: No complaint of sore throat or soreness of mouth. Respiratory:  Ongoing wheezing, intermittent times of shortness of breath, eased with rest, dilaudid if necessary. Cardiac: Ongoing lower extremity edema Hematological: Easy bruising, not a new complaint. Gastrointestinal: No complaint of abdominal pain.  Vital signs: Blood pressure-98/52 Pulse-70 Respiratory rate-20 Temperature-97.2 Pulse oximetry-95%  Physical examination:  Constitutional:  Neat well-groomed, well-nourished well-developed, in no acute distress. Eyes:  Conjunctiva is pale, extraocular movements are intact, left pupil is slightly larger than the right pupil. Oropharynx:  Dentures in place, no visible oral lesion Neck: Supple, no palpable adenopathy, or thyromegaly.  No noted JVD Respiratory:  Mild intercostal retractions, no acute distress. Almost no breath sounds noted right posterior bases, diminished but with wheezing throughout the posterior lung fields. No distinct rales or rhonchi. Cardiac: Regular rate and rhythm, palpable  pedal dorsalis pulses, approximately trace to 1+ edema medial ankles, otherwise unremarkable. Abdomen: Positive bowel sounds, soft and nontender, no suprapubic tenderness. Musculoskeletal: Ambulate with a  wheelchair Integumentary:  Across her face bilaterally, below the eyes, or patchy areas potentially actinic. Keratoses. Right lateral forearm, is an area that appears like a "horn", likely consistent with basal cell carcinoma. Neurological: Tremor noted of left hand and left foot Psychiatric:  Oriented to person place and time, affect and verbal content appropriate.  Recent diagnostic tests:  07/25/2012 WBC 8.4 RBC 3.7 Hemoglobin 10.7 Hematocrit 36.1 MCV 97.6 MCH 28.9 MCHC 29.6 RDW 15.7 Platelets 220  Basic metabolic panel: Sodium 141 Potassium 4.4 Chloride 94 CO2 43 AGAP 4 Glucose 79 BUN 16 Creatinine 0.9 BUN/CR 18.4 Calcium 8.8  COPD: We'll continue the patient's current pulmonary meds Hypertension/congestive heart failure: We'll continue her cardiac medications as well as her Lasix. Patient's weight is being checked each morning. Anxiety/depression:  We'll continue her antidepressant and anxiolytic. Likely basal cell carcinoma: We'll consider dermatological referral versus [facility].  Time spent with patient 50 minutes           This encounter was created in error - please disregard.

## 2012-11-12 NOTE — Progress Notes (Deleted)
10/03/2012  Patient name:  [name]  DOB:  1940/12/21  Medical ID:  [id number] CODE STATUS: DO NOT RESUSCITATE Facility:  [facility], room number 204B  Allergies: Morphine Codeine Erythromycin  Chief complaint: Medical management of patient's chronic illnesses.  Patient also reports flaking skin areas are across her face below her eyes. Also a "growth" on her right lateral forearm.  Diagnoses: COPD, bronchiectasis Congestive heart failure Hypertension Diverticulosis Skin malignancy Sleep apnea Insomnia Depression  History of present illness: Patient is being seen today for a routine visit. She is a patient of hospice with meds [place] for end-stage COPD. She uses BiPAP at that time and is on 4 L of oxygen continuous. Patient reports significant loss of lung function to bronchiectasis along with damage caused by long-term cigarette use subsequently leading to severe COPD. Report shortness of breath with activity, however she is able to walk with episodic periods of rest. Reports that I wanted is helpful when she had severe shortness of breath.    Medications: Daliresp , one tab each day Spiriva 18 mcg, contents of one capsule inhaled each day Deltasone 5 mg one tablet each day Aspirin 81 mg one tablet each day Celexa 20 mg each day K-Dur 20 mEq each day MiraLax 17 g in 8 ounces of liquid each day Flonase 50 mcg, 2 puffs each nares once a day Lasix 20 mg, 3 tablets each day Ocuvite 1 tablet each day Advair Diskus 500/50 one puff inhaled twice a day Prevacid 30 mg one capsule each day Ferrous sulfate 325 mg one tablet twice a day Coreg 6.25 mg one tablet twice a day Neurontin 100 mg one capsule 3 times a day DuoNeb one treatment of 3 mL via nebulizer 4 times a day Mucus relief tab 400 mg one tablet 3 times a day Xanax 0.5 mg one tablet 3 times a day for anxiety Ambien 5 mg one tablet each night Oxygen at 4 L continuous Systane eyedrops 2 drops in each eye as  needed Hydromorphone liquid, 1 mg per mL, 0.5 mL once every 4 hours as needed for shortness of breath Tylenol extra strength, 500 mg, 2 tablets at bedtime Senokot S2 tablets at bedtime  Social history: Patient started smoking at the age of 70, in 24, and stopped smoking approximately 2008. She reports consumption of 3 packs of cigarettes per day. Approximately, 165-pack-year history. No history of alcohol or recreational substance use.  Review of systems: Patient reports that she will Skin:  Verbalizes concern regarding patchy areas across her face, just below her eyes. Also regarding a "horny" appearing growth on her right lateral forearm. Ears:  No complaints of ear pain Mouth/throat: No complaint of sore throat or soreness of mouth. Respiratory:  Ongoing wheezing, intermittent times of shortness of breath, eased with rest, dilaudid if necessary. Cardiac: Ongoing lower extremity edema Hematological: Easy bruising, not a new complaint. Gastrointestinal: No complaint of abdominal pain.  Vital signs: Blood pressure-98/52 Pulse-70 Respiratory rate-20 Temperature-97.2 Pulse oximetry-95%  Physical examination:  Constitutional:  Neat well-groomed, well-nourished well-developed, in no acute distress. Eyes:  Conjunctiva is pale, extraocular movements are intact, left pupil is slightly larger than the right pupil. Oropharynx:  Dentures in place, no visible oral lesion Neck: Supple, no palpable adenopathy, or thyromegaly.  No noted JVD Respiratory:  Mild intercostal retractions, no acute distress. Almost no breath sounds noted right posterior bases, diminished but with wheezing throughout the posterior lung fields. No distinct rales or rhonchi. Cardiac: Regular rate and rhythm, palpable  pedal dorsalis pulses, approximately trace to 1+ edema medial ankles, otherwise unremarkable. Abdomen: Positive bowel sounds, soft and nontender, no suprapubic tenderness. Musculoskeletal: Ambulate with a  wheelchair Integumentary:  Across her face bilaterally, below the eyes, or patchy areas potentially actinic. Keratoses. Right lateral forearm, is an area that appears like a "horn", likely consistent with basal cell carcinoma. Neurological: Tremor noted of left hand and left foot Psychiatric:  Oriented to person place and time, affect and verbal content appropriate.  Recent diagnostic tests:  07/25/2012 WBC 8.4 RBC 3.7 Hemoglobin 10.7 Hematocrit 36.1 MCV 97.6 MCH 28.9 MCHC 29.6 RDW 15.7 Platelets 220  Basic metabolic panel: Sodium 141 Potassium 4.4 Chloride 94 CO2 43 AGAP 4 Glucose 79 BUN 16 Creatinine 0.9 BUN/CR 18.4 Calcium 8.8  COPD: We'll continue the patient's current pulmonary meds Hypertension/congestive heart failure: We'll continue her cardiac medications as well as her Lasix. Patient's weight is being checked each morning. Anxiety/depression:  We'll continue her antidepressant and anxiolytic. Likely basal cell carcinoma: We'll consider dermatological referral versus [facility].  Time spent with patient 50 minutes

## 2012-12-08 DEATH — deceased

## 2013-02-19 NOTE — Progress Notes (Signed)
date of visit 10/03/2012 Room [name] Patient ID: [name], female   DOB: Jan 16, 1941, 73 y.o.   MRN: [id number]  CODE STATUS: DO NOT RESUSCITATE Facility:  [facility], room number 204B  Allergies: Morphine Codeine Erythromycin  Chief Complaint  Patient presents with  . Medical Managment of Chronic Issues   Medical management of patient's chronic illnesses.   Diagnoses: COPD, bronchiectasis Congestive heart failure Hypertension Diverticulosis Skin malignancy Sleep apnea Insomnia Depression  History of present illness: Patient is being seen today for a routine visit. She is a patient of hospice with meds [place] for end-stage COPD.     She uses BiPAP at that time and is on 4 L of oxygen continuous. Patient reports significant loss of lung function to bronchiectasis along with damage caused by long-term cigarette use subsequently leading to severe COPD. Report shortness of breath with activity, however she is able to walk with episodic periods of rest. Reports that dialogue it is helpful with severe shortness of breath Reports flaking areas across her face, below her eyes, and also growth on her right lateral forearm.   Review of Systems  Eyes: Negative for pain, discharge and redness.  Respiratory: Positive for shortness of breath.   Gastrointestinal: Negative for blood in stool.  Genitourinary: Negative for hematuria and flank pain.  Skin: Positive for itching.       Concern related to a growth on her right lateral forearm  Neurological: Positive for weakness. Negative for seizures and loss of consciousness.  Psychiatric/Behavioral: Negative for suicidal ideas, hallucinations and substance abuse.    Medications: Daliresp 542mcg, one tab each day Spiriva 18 mcg, contents of one capsule inhaled each day Deltasone 5 mg one tablet each day Aspirin 81 mg one tablet each day Celexa 20 mg each day K-Dur 20 mEq each day MiraLax 17 g in 8 ounces of  liquid each day Flonase 50 mcg, 2 puffs each nares once a day Lasix 20 mg, 3 tablets each day Ocuvite 1 tablet each day Advair Diskus 500/50 one puff inhaled twice a day Prevacid 30 mg one capsule each day Ferrous sulfate 325 mg one tablet twice a day Coreg 6.25 mg one tablet twice a day Neurontin 100 mg one capsule 3 times a day DuoNeb one treatment of 3 mL via nebulizer 4 times a day Mucus relief tab 400 mg one tablet 3 times a day Xanax 0.5 mg one tablet 3 times a day for anxiety Ambien 5 mg one tablet each night Oxygen at 4 L continuous Systane eyedrops 2 drops in each eye as needed Hydromorphone liquid, 1 mg per mL, 0.5 mL once every 4 hours as needed for shortness of breath Tylenol extra strength, 500 mg, 2 tablets at bedtime Senokot S2 tablets at bedtime  Social history: Patient started smoking at the age of 65, in 89, and stopped smoking approximately 2008. She reports consumption of 3 packs of cigarettes per day. Approximately, 165-pack-year history. No history of alcohol or recreational substance use.  Review of systems: Patient reports that she will Skin:  Verbalizes concern regarding patchy areas across her face, just below her eyes. Also regarding a "horny" appearing growth on her right lateral forearm. Ears:  No complaints of ear pain Mouth/throat: No complaint of sore throat or soreness of mouth. Respiratory:  Ongoing wheezing, intermittent times of shortness of breath, eased with rest, dilaudid if necessary. Cardiac: Ongoing lower extremity edema Hematological: Easy bruising, not a new complaint. Gastrointestinal: No complaint  of abdominal pain.  Vital signs: Blood pressure-98/52 Pulse-70 Respiratory rate-20 Temperature-97.2 Pulse oximetry-95%  Physical examination:  Constitutional:  Neat well-groomed, well-nourished well-developed, in no acute distress. Eyes:  Conjunctiva is pale, extraocular movements are intact, left pupil is slightly larger than the  right pupil. Oropharynx:  Dentures in place, no visible oral lesion Neck: Supple, no palpable adenopathy, or thyromegaly.  No noted JVD Respiratory:  Mild intercostal retractions, no acute distress. Almost no breath sounds noted right posterior bases, diminished but with wheezing throughout the posterior lung fields. No distinct rales or rhonchi. Cardiac: Regular rate and rhythm, palpable pedal dorsalis pulses, approximately trace to 1+ edema medial ankles, otherwise unremarkable. Abdomen: Positive bowel sounds, soft and nontender, no suprapubic tenderness. Musculoskeletal: Ambulate with a wheelchair Integumentary:  Across her face bilaterally, below the eyes, or patchy areas potentially actinic. Keratoses. Right lateral forearm, is an area that appears like a "horn", likely consistent with basal cell carcinoma. Neurological: Tremor noted of left hand and left foot Psychiatric:  Oriented to person place and time, affect and verbal content appropriate.  Recent diagnostic tests:  07/25/2012 WBC 8.4 RBC 3.7 Hemoglobin 10.7 Hematocrit 36.1 MCV 97.6 MCH 28.9 MCHC 29.6 RDW 15.7 Platelets 409  Basic metabolic panel: Sodium 735 Potassium 4.4 Chloride 94 CO2 43 AGAP 4 Glucose 79 BUN 16 Creatinine 0.9 BUN/CR 18.4 Calcium 8.8  COPD: We'll continue the patient's current pulmonary meds, including Spiriva, DuoNeb, and Ambien. We'll monitor her pulmonary status carefully. We'll certainly continue the oxygen. I wanted is available if she has severe shortness of breath.  Hypertension/congestive heart failure: We'll continue her cardiac medications as well as her Lasix. Patient's weight is being checked each morning . Anxiety/depression:  We'll continue her antidepressant and anxiolytic.  Likely basal cell carcinoma: We'll consider dermatological referral versus [facility].  Have spoken to the staff related to a dermatological referral.

## 2013-02-19 NOTE — Progress Notes (Signed)
date of visit 10/19/2012 Room #[address] [facility] CODE STATUS DO NOT RESUSCITATE  Patient ID: [name], female   DOB: [birth year], 73 y.o.   MRN: [id number]    Allergies: Morphine Codeine Erythromycin  Chief Complaint  Patient presents with  . Discharge Note    Diagnoses: COPD, bronchiectasis Congestive heart failure Hypertension Diverticulosis Skin malignancy Sleep apnea Insomnia Depression  History of present illness: Patient is being discharged today she will be [facility]. She is a patient of Hospice for end-stage COPD.  She will be discharged with her medications   She uses BiPAP at that time and is on 4 L of oxygen continuous. Patient reports significant loss of lung function to bronchiectasis along with damage caused by long-term cigarette use subsequently leading to severe COPD. Report shortness of breath with activity, however she is able to walk with episodic periods of rest. Reports that dilaudid  is helpful with severe shortness of breath Reports flaking areas across her face, below her eyes, and also growth on her right lateral forearm.   Review of Systems  Eyes: Negative for pain, discharge and redness.  Respiratory: Positive for shortness of breath.   Gastrointestinal: Negative for blood in stool.  Genitourinary: Negative for hematuria and flank pain.  Skin: Positive for itching.       Concern related to a growth on her right lateral forearm  Neurological: Positive for weakness. Negative for seizures and loss of consciousness.  Psychiatric/Behavioral: Negative for suicidal ideas, hallucinations and substance abuse.    Medications: Daliresp 534mcg, one tab each day Spiriva 18 mcg, contents of one capsule inhaled each day Deltasone 5 mg one tablet each day Aspirin 81 mg one tablet each day Celexa 20 mg each day K-Dur 20 mEq each day MiraLax 17 g in 8 ounces of liquid each day Flonase 50 mcg, 2 puffs each nares once a  day Lasix 20 mg, 3 tablets each day Ocuvite 1 tablet each day Advair Diskus 500/50 one puff inhaled twice a day Prevacid 30 mg one capsule each day Ferrous sulfate 325 mg one tablet twice a day Coreg 6.25 mg one tablet twice a day Neurontin 100 mg one capsule 3 times a day DuoNeb one treatment of 3 mL via nebulizer 4 times a day Mucus relief tab 400 mg one tablet 3 times a day Xanax 0.5 mg one tablet 3 times a day for anxiety Ambien 5 mg one tablet each night Oxygen at 4 L continuous Systane eyedrops 2 drops in each eye as needed Hydromorphone liquid, 1 mg per mL, 0.5 mL once every 4 hours as needed for shortness of breath Tylenol extra strength, 500 mg, 2 tablets at bedtime Senokot S2 tablets at bedtime  Social history: Patient started smoking at the age of 12, in 25, and stopped smoking approximately 2008. She reports consumption of 3 packs of cigarettes per day. Approximately, 165-pack-year history. No history of alcohol or recreational substance use.  Review of systems: Patient reports that she will Skin:  Verbalizes concern regarding patchy areas across her face, just below her eyes. Also regarding a "horny" appearing growth on her right lateral forearm. Ears:  No complaints of ear pain Mouth/throat: No complaint of sore throat or soreness of mouth. Respiratory:  Ongoing wheezing, intermittent times of shortness of breath, eased with rest, dilaudid if necessary. Cardiac: Ongoing lower extremity edema Hematological: Easy bruising, not a new complaint. Gastrointestinal: No complaint of abdominal pain.  Vital signs:  Blood pressure-10062 Pulse-76 Respiratory rate-22 Temperature-98.1 Pulse oximetry-96%  Physical examination:  Constitutional:  Neat well-groomed, well-nourished well-developed, in no acute distress. Eyes:  Conjunctiva is pale, extraocular movements are intact, left pupil is slightly larger than the right pupil. External ears unremarkable Oropharynx:   Dentures in place, no visible oral lesion Neck: Supple, no palpable adenopathy, or thyromegaly.   Respiratory:  Mild intercostal retractions, no acute distress. Almost no breath sounds noted right posterior bases, diminished but with wheezing throughout the posterior lung fields. No distinct rales or rhonchi. Cardiac: Regular rate and rhythm, palpable pedal dorsalis pulses, approximately trace to 1+ edema medial ankles, otherwise unremarkable. Abdomen: Positive bowel sounds, soft and nontender, no suprapubic tenderness. Musculoskeletal: Ambulate with a wheelchair Integumentary:  Across her face bilaterally, below the eyes, or patchy areas potentially actinic. Keratoses. Right lateral forearm, is an area that appears like a "horn", likely consistent with basal cell carcinoma. Neurological: Tremor noted of left hand and left foot Psychiatric:  Oriented to person place and time, affect and verbal content appropriate.  Recent diagnostic tests:  07/25/2012 WBC 8.4 RBC 3.7 Hemoglobin 10.7 Hematocrit 36.1 MCV 97.6 MCH 28.9 MCHC 29.6 RDW 15.7 Platelets 892  Basic metabolic panel: Sodium 119 Potassium 4.4 Chloride 94 CO2 43 AGAP 4 Glucose 79 BUN 16 Creatinine 0.9 BUN/CR 18.4 Calcium 8.8  COPD: We'll continue the patient's current pulmonary meds, Mucus relief 400 mg three times a day,  Deltasone 5 mg per day, including Spiriva, DuoNeb, Daliresp 500 mcg per day and Advair 500/50, she will be followed by a provider in her new facility.   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 Patient's weight is being checked each morning, likely they will follow the same protocol and her new facility. . Anxiety/depression:  We'll continue her Celexa 20 mg per day. and  Xanax at 0.5 mg three times a day. Would anticipate these medications being continued on her new  facility.  Anemia, continue Ferrous Sulfate 325 mg twice a day, and Ocuvite one per day will be followed by provider at her new facility.    Pain, Tylenol, 500 mg two tabs at bedtime.  Dilaudid 1 mg per ml, 0.5 ml every 4 hours as needed for shortness of breath, Neurontin 100 mg three timea day,   Constipation, will continue Senokot-S, two tabs at bedtime.    Pt will be monitored and followed at her new facility.  Likely basal cell carcinoma: We'll consider dermatological referral versus [facility].  Have spoken to the staff related to a dermatological referral.  Dermatological recommendations will likely be followed up in the facility.

## 2014-06-01 NOTE — Discharge Summary (Signed)
PATIENT NAMENICOLA, [name] MR#:  [id number] DATE OF BIRTH:  [birth year]  DATE OF ADMISSION:  03/22/2011 DATE OF DISCHARGE:  03/28/2011  For a detailed note, please take a look at the History and Physical done on admission by Dr. Harrel Lemon.   DISCHARGE DIAGNOSES:  1. Acute on chronic respiratory failure secondary to chronic obstructive pulmonary disease exacerbation.  2. Chronic obstructive pulmonary disease exacerbation, currently on home oxygen at 4 liters. 3. Hypertension.  4. Depression.  5. Hyperlipidemia.  6. Macular degeneration.   DISCHARGE DIET: The patient is being discharged on a low-sodium, low-fat diet.   ACTIVITY: As tolerated.  DISCHARGE FOLLOWUP: Followup with Dr. Wallene Huh in the next 1 to 2 weeks.  DISCHARGE MEDICATIONS: 1. Centrum multivitamin daily.  2. Crestor 20 mg daily.  3. Aspirin 81 mg daily.  4. Amlodipine 10 mg daily.  5. Lasix 20 mg daily.  6. Prevacid 30 mg twice a day. 7. Aleve 220 mg twice a day as needed for pain.  8. Oxygen 4 liters nasal cannula. 9. Spiriva 1 puff daily.  10. Advair 500/50 one puff twice a day. 11. Daliresp 500 mcg daily. 12. Albuterol nebulizer four times daily as needed.  13. Tylenol extra-strength 2 tabs daily as needed. 14. Guaifenesin 400 mg 1 tab every 6 hours p.r.n. 15. Celexa 40 mg daily.  16. Prednisone taper starting at 60 mg down to 10 mg over the next 12 days.  17. Levaquin 500 mg daily x 7 days.   CONSULTANT: Wallene Huh, MD  PERTINENT STUDIES: Chest x-ray on 03/22/2011 showed mild increased density in the right suprahilar region, chronically increased interstitial markings.   CT scan of the chest done with contrast showed partially calcified pleural plaque in the right upper lobe. 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 Mediastinal lymph nodes.   HOSPITAL COURSE: This is a 74 year old female with medical problems as mentioned above who presented to the hospital on 03/22/2011  secondary to shortness of breath and cough.  1. Acute respiratory failure: This was acute on chronic respiratory failure secondary to the patient's chronic obstructive pulmonary disease exacerbation. The patient was brought to the hospital and started on aggressive IV steroids, around-the-clock nebulizer treatments, maintenance inhalers, and also empiric antibiotics. The patient was initially started on Levaquin. Her sputum cultures actually grew out some gram-negative diplococci and gram-negative rods but it was an adequate specimen. We attempted to get a repeat sputum culture, but again was an inadequate specimen. She was treated with a few days of IV meropenem empirically along with maintenance treatment, as mentioned above. The patient was also started on BiPAP for 2 to 3 days overnight. After receiving BiPAP and also IV steroids and antibiotics her clinical condition has significantly improved. She is moving better air and is back down to her baseline O2. She is therefore currently being discharged on a p.o. prednisone taper along with her maintenance inhalers and nebulizer treatments and close follow-up with pulmonary as an outpatient. The patient was seen here in the hospital by Dr. Raul Del and she prefers to continue seeing him. She was seeing a pulmonologist in [place], but she does not want to followup there as it is too far of a distance for her to travel.  2. Acute chronic obstructive pulmonary disease exacerbation: As mentioned, the patient was treated aggressively with IV steroids, maintenance Advair and Spiriva, and also theophylline and Daliresp. The patient's clinical condition has significantly improved since then. Presently she will continue her maintenance  inhalers, nebulizer treatments, finish a prednisone taper and Levaquin as stated and she will have followup with Dr. Raul Del as an outpatient.  3. Hyperlipidemia: The patient was maintained on Crestor. She will resume that.   4. Hypertension: The patient remained hemodynamically stable during the hospital course. She will resume her Norvasc upon discharge. 5. Depression: The patient was maintained on Celexa which she will resume upon discharge too.   CODE STATUS: THE PATIENT IS A LIMITED CODE WITH NO CPR AND NO DEFIBRILLATION OR CARDIOVERSION, BUT YES TO MECHANICAL VENTILATION AND INTUBATION.  TIME SPENT ON DISCHARGE: 40 minutes. ____________________________ Belia Heman. Verdell Carmine, MD [id number] D: 03/28/2011 14:46:08 ET T: 03/29/2011 10:23:12 ET JOB#: [id number]  cc: Belia Heman. Verdell Carmine, MD, <Dictator> Herbon E. Raul Del, MD Henreitta Leber MD ELECTRONICALLY SIGNED 03/30/2011 15:55

## 2014-06-01 NOTE — H&P (Signed)
PATIENT NAMESUNDY, [name] MR#:  [id number] DATE OF BIRTH:  June 07, 1940  DATE OF ADMISSION:  03/22/2011  PRIMARY CARE PHYSICIAN: Alexa Norris, MD   CHIEF COMPLAINT: Shortness of breath.   HISTORY OF PRESENT ILLNESS: This is a 74 year old female who has end-stage COPD. She is normally on 4 liters home oxygen. For the past four days she has had worsening shortness of breath. She has had fever, chills, and productive sputum. Today her shortness of breath was much worse. She came in and is found to be hypoxic with oxygen sat at 85%. She is currently on 5 liters nasal cannula. She is able to answer some questions with complete sentences but is very tachypneic on doing that.   PAST MEDICAL HISTORY:  1. Chronic respiratory failure, on 4 liters home oxygen.  2. End-stage COPD disease.  3. Depression.  4. Hyperlipidemia.  5. Hypertension.  6. Osteoporosis.  7. Macular degeneration.   PAST SURGICAL HISTORY:  1. 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  2. Hysterectomy.   ALLERGIES: Morphine, erythromycin, and codeine.   CURRENT MEDICATIONS:  1. Advair 500/50 one puff b.i.d.  2. Albuterol nebulizers q.i.d.  3. Aleve 220 mg b.i.d. p.r.n.  4. Amlodipine 10 mg daily.  5. Aspirin 81 mg daily.  6. Centrum Silver daily.  7. Citalopram 40 mg daily.  8. Crestor 20 mg daily.  9. Daliresp 500 mg daily.  10. Lasix 20 mg daily.  11. Guaifenesin 400 mg 6 times a day. 12. Lansoprazole 30 mg b.i.d.  13. Oxygen 4 liters nasal cannula.  14. Spiriva 1 puff daily.  15. Tylenol PM 2 tabs at bedtime p.r.n.   SOCIAL HISTORY: She stopped smoking three years ago. Does not drink alcohol.   FAMILY HISTORY: Significant for coronary artery disease.   REVIEW OF SYSTEMS: CONSTITUTIONAL: She has had fever and chills. EYES: No blurred vision. ENT: No hearing loss. CARDIOVASCULAR: No chest pain. PULMONARY: Some shortness of breath. GI: No nausea or vomiting. GU: No dysuria. ENDOCRINE: No heat or cold  intolerance. INTEGUMENTARY: No rash. MUSCULOSKELETAL: Occasional joint pain. NEUROLOGIC: No numbness or weakness.   PHYSICAL EXAMINATION:   VITAL SIGNS: Temperature 99.7, pulse 108, respirations 22, blood pressure 173/83, oxygen sat 92% on 5 liters.   GENERAL: This is an obese white female in moderate respiratory distress.   HEENT: The pupils are equal, round, and reactive to light. Sclerae nonicteric. Oral mucosa is moist. Oropharynx is clear. Nasopharynx is clear.   NECK: Supple. No JVD, lymphadenopathy, or thyromegaly.   CARDIOVASCULAR: Tachy with no murmurs.   LUNGS: There is poor air movement, some expiratory wheezing. No dullness to percussion. She is using accessory muscles.   ABDOMEN: Soft, nontender, nondistended. Bowel sounds are positive. No hepatosplenomegaly. No masses.   EXTREMITIES: There is 1+ lower extremity edema.   NEUROLOGIC: Cranial nerves II through XII are intact. She is alert and oriented x4.   SKIN: Moist with no rash.   LABORATORY, DIAGNOSTIC, AND RADIOLOGICAL DATA: Chest x-ray shows a patchy area in the perihilar region and chronic changes from her surgeries. White blood cells 6.9, hemoglobin 10.1, BUN 13, creatinine 0.84.   ASSESSMENT AND PLAN:  1. Acute on chronic respiratory failure. She is requiring more oxygen. She is in some distress. Will go ahead and increase her oxygen, treat underlying cause. Discussed in detail about if she needed to be intubated and she is okay with that unless she is a LIMITED CODE. I am concerned about her condition deteriorating so will  keep her under close observation and treat underlying cause.  2. Pneumonia. Her chest x-ray looks like a perihilar infiltrate but it is difficult to read because of her old scarring from her surgery. With her low-grade fever and her symptoms, I will go ahead and treat so I'm going to put her on some ceftriaxone and Levaquin. Get sputum and blood cultures.  3. Chronic obstructive pulmonary disease  exacerbation. I'm going to treat her with IV steroids and frequent bronchodilator therapies.  4. Hypertension. Will continue her current medications.   TOTAL CRITICAL CARE TIME SPENT: 45 minutes.   ____________________________ Alexa Hire, MD [id number] D: 03/22/2011 17:54:05 ET T: 03/22/2011 18:04:56 ET JOB#: [id number]  cc: Alexa Hire, MD, <Dictator> Alexa Norris, MD Alexa Hire MD ELECTRONICALLY SIGNED 03/23/2011 13:00
